# Patient Record
Sex: Female | Born: 1950 | Race: Black or African American | Hispanic: No | Smoking: Former smoker
Health system: Southern US, Community
[De-identification: ages and names within clinical notes are randomized; demographics above are authoritative.]

## PROBLEM LIST (undated history)

## (undated) DIAGNOSIS — M549 Dorsalgia, unspecified: Secondary | ICD-10-CM

## (undated) DIAGNOSIS — F32A Depression, unspecified: Secondary | ICD-10-CM

## (undated) DIAGNOSIS — D869 Sarcoidosis, unspecified: Secondary | ICD-10-CM

## (undated) DIAGNOSIS — E785 Hyperlipidemia, unspecified: Secondary | ICD-10-CM

## (undated) DIAGNOSIS — E559 Vitamin D deficiency, unspecified: Secondary | ICD-10-CM

## (undated) DIAGNOSIS — F329 Major depressive disorder, single episode, unspecified: Secondary | ICD-10-CM

## (undated) DIAGNOSIS — I10 Essential (primary) hypertension: Secondary | ICD-10-CM

## (undated) DIAGNOSIS — R42 Dizziness and giddiness: Secondary | ICD-10-CM

## (undated) DIAGNOSIS — K5190 Ulcerative colitis, unspecified, without complications: Secondary | ICD-10-CM

## (undated) DIAGNOSIS — R519 Headache, unspecified: Secondary | ICD-10-CM

## (undated) DIAGNOSIS — K219 Gastro-esophageal reflux disease without esophagitis: Secondary | ICD-10-CM

## (undated) DIAGNOSIS — R51 Headache: Secondary | ICD-10-CM

## (undated) DIAGNOSIS — G8929 Other chronic pain: Secondary | ICD-10-CM

## (undated) DIAGNOSIS — F419 Anxiety disorder, unspecified: Secondary | ICD-10-CM

## (undated) HISTORY — DX: Headache: R51

## (undated) HISTORY — DX: Dizziness and giddiness: R42

## (undated) HISTORY — PX: APPENDECTOMY: SHX54

## (undated) HISTORY — DX: Sarcoidosis, unspecified: D86.9

## (undated) HISTORY — DX: Vitamin D deficiency, unspecified: E55.9

## (undated) HISTORY — DX: Hyperlipidemia, unspecified: E78.5

## (undated) HISTORY — PX: KNEE SURGERY: SHX244

## (undated) HISTORY — DX: Headache, unspecified: R51.9

## (undated) HISTORY — DX: Anxiety disorder, unspecified: F41.9

## (undated) HISTORY — DX: Gastro-esophageal reflux disease without esophagitis: K21.9

## (undated) HISTORY — PX: TONSILLECTOMY: SUR1361

## (undated) HISTORY — PX: SHOULDER SURGERY: SHX246

## (undated) HISTORY — DX: Depression, unspecified: F32.A

## (undated) HISTORY — DX: Ulcerative colitis, unspecified, without complications: K51.90

## (undated) HISTORY — DX: Other chronic pain: G89.29

---
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
## 1999-02-02 ENCOUNTER — Ambulatory Visit (HOSPITAL_COMMUNITY): Admission: RE | Admit: 1999-02-02 | Discharge: 1999-02-02 | Payer: Self-pay | Admitting: Neurosurgery

## 1999-02-02 ENCOUNTER — Encounter: Payer: Self-pay | Admitting: Neurosurgery

## 2002-03-23 ENCOUNTER — Ambulatory Visit (HOSPITAL_COMMUNITY): Admission: RE | Admit: 2002-03-23 | Discharge: 2002-03-23 | Payer: Self-pay | Admitting: Orthopedic Surgery

## 2003-10-22 ENCOUNTER — Inpatient Hospital Stay (HOSPITAL_COMMUNITY): Admission: EM | Admit: 2003-10-22 | Discharge: 2003-10-24 | Payer: Self-pay | Admitting: Emergency Medicine

## 2005-05-22 ENCOUNTER — Encounter: Admission: RE | Admit: 2005-05-22 | Discharge: 2005-06-04 | Payer: Self-pay | Admitting: Pediatrics

## 2006-02-17 ENCOUNTER — Encounter: Admission: RE | Admit: 2006-02-17 | Discharge: 2006-04-10 | Payer: Self-pay | Admitting: Internal Medicine

## 2010-04-08 ENCOUNTER — Emergency Department (HOSPITAL_COMMUNITY): Admission: EM | Admit: 2010-04-08 | Discharge: 2010-04-08 | Payer: Self-pay | Admitting: Family Medicine

## 2010-05-03 ENCOUNTER — Encounter: Admission: RE | Admit: 2010-05-03 | Discharge: 2010-08-01 | Payer: Self-pay | Admitting: Orthopedic Surgery

## 2010-06-20 ENCOUNTER — Encounter: Admission: RE | Admit: 2010-06-20 | Discharge: 2010-06-20 | Payer: Self-pay | Admitting: Orthopedic Surgery

## 2011-05-03 NOTE — H&P (Signed)
NAME:  EMERSON, BARRETTO NO.:  [id number]   MEDICAL RECORD NO.:  [id number]                   PATIENT TYPE:  EMS   LOCATION:  ED                                   FACILITY:  [facility]   PHYSICIAN:  Sherin Quarry, MD                   DATE OF BIRTH:  [birth year]   DATE OF ADMISSION:  10/22/2003  DATE OF DISCHARGE:                                HISTORY & PHYSICAL   REASON FOR ADMISSION:  Caleb Popp is a pleasant lady who presents to  [facility] on the day of this dictation.  According to the  patient, two weeks ago she presented to her doctor with complaints of  abdominal pain and underwent a CT scan of the abdomen.  The CT scan of the  abdomen was reported to be normal but the lower portion of the lungs  revealed diffuse inflammatory changes.  Therefore, a CT scan of the chest  was scheduled and the patient was placed on Tequin on Tuesday of this week.  On Thursday after the CT scan was completed, the patient reports that she  experienced nausea and felt an unusual pounding feeling on both sides of her  chest.  On Friday, she was re-evaluated at her doctor's office and was  advised that her CT scan suggested that she had walking pneumonia.  She was  therefore placed on a Z-Pak as well as Tequin and was instructed to return  to the office in followup.  Today, she went to work at [employer] where she  works part-time.  While she was working there, she began to notice she was  having some difficulty breathing.  She felt cold, her chest felt tight, and  she says that she felt as though she could not catch her breath.  She  therefore presented to [facility] where a chest x-ray was obtained  which showed diffuse interstitial changes consistent with either atypical  pneumonia or an unusual presentation of congestive heart failure.  Of note  is that in 1993, the patient was evaluated by a pulmonologist in [facility]  because of an abnormal chest  x-ray.  This doctor suggested that she might  have sarcoidosis and did a bronchoscopy which apparently was nondiagnostic.  She thinks that she has had a chest x-ray since then that was normal but she  says it must have been several years ago.  She does not smoke.  She has not  been having any fever.  There has been no productive cough.  There has been  no chest discomfort with the exception of the unusual pounding feeling on  Thursday.  There has been no nausea or vomiting, no melena or hematochezia,  no unusual rash, and no unusual joint pain.  In light of her findings on  examination in the emergency room, it was felt prudent to admit her to  the  hospital at this time.   PAST MEDICAL HISTORY:   CURRENT MEDICATIONS:  1. Benicar HCT one tablet daily.  2. Prilosec 20 mg daily.  3. Tequin and Z-Pak which she is taking as noted above.   ALLERGIES:  She is ALLERGIC to SULFA DRUGS, PERCOCET, NEXIUM, CEFZIL, and  BETADINE.   ILLNESSES:  In addition to the above, the patient has a history of  hypertension which is currently well regulated.  She also has a history of  irritable bowel and has a colonoscopy every three years because of findings  of polyps.   OPERATIONS:  She has had a previous shoulder operation, appendectomy, and  tonsillectomy.   FAMILY HISTORY:  She states that she has a sibling who has hypertension and  two siblings who have atrial fibrillation.  Her father died of an MI in  53.  Her mother has dementia.   SOCIAL HISTORY:  She currently lives with her mother as well as with her  daughter.  She works part-time at [employer].  She does not smoke or abuse  alcohol or drugs according to the patient.   REVIEW OF SYSTEMS:  HEAD:  She denies headache or dizziness.  EYES:  She  denies visual blurring or diplopia. EAR, NOSE, AND THROAT:  Denies earache,  sinus pain, or sore throat.  CHEST:  She has not been coughing.  She denies  productive cough.  She denies respiratory  difficulty except for today.  CARDIOVASCULAR:  She denies orthopnea, PND, ankle edema.  She has had mild  exertional dyspnea.  ABDOMEN:  She has been having chronic complaints of a  burning feeling in the mid epigastric area.  She denies hematemesis or  melena.  There has been no hematochezia.  GU:  She denies dysuria or urinary  frequency, hesitancy, or nocturia.  RHEUMATOLOGIC:  She denies back pain or  joint pain.  HEMATOLOGIC:  She denies easy bleeding or bruising.  NEUROLOGICAL:  She denies history of seizure or stroke.   PHYSICAL EXAMINATION:  GENERAL:  She is a comfortable-appearing lady who  does not appear to be in distress.  HEENT:  Within normal limits.  CHEST:  A few fine rales at the right base.  There are no audible wheezes.  No rhonchi.  CARDIOVASCULAR:  Normal S1 & S2.  There are no rubs, murmurs, or gallops.  ABDOMEN:  Benign.  Normal bowel sounds without masses, tenderness, or  organomegaly.  NEUROLOGICAL:  Neurological testing is within normal limits.  EXTREMITIES:  No evaluation of cyanosis or edema.  DP and PT pulses are  intact.   As mentioned, chest x-ray shows diffuse patchy infiltrates of uncertain  etiology.  The white count is 7300.  The CMET is normal.   IMPRESSION:  1. Diffuse interstitial changes on chest x-ray with intermittent shortness     of breath.     a. Consider atypical pneumonia possibly of viral or bacterial origin.     b. Unusual presentation of congestive heart failure.     c. Possibly sarcoidosis or collagen vascular disease.  2. Hypertension.  3. History of chronic gastroesophageal reflux disease.  4. Chronic irritable bowel.  5. Multiple drug allergies.   PLAN:  To admit the patient to the hospital.  I am going to continue her on  Zithromax and Tequin as I think this is a good choice for her treatment if  this does turn out to be a pneumonia.  We will obtain a sed rate, a  BNP, ANA, and also a 2-D echocardiogram to evaluate cardiac  status.  I think this  patient would benefit from a pulmonary consult.  Will follow her oxygenation  closely.                                               Sherin Quarry, MD    SY/MEDQ  D:  10/22/2003  T:  10/22/2003  Job:  [id number]   cc:   Dr. Yves Dill  [place], [place]

## 2011-06-02 DIAGNOSIS — R079 Chest pain, unspecified: Secondary | ICD-10-CM

## 2011-06-02 DIAGNOSIS — I471 Supraventricular tachycardia: Secondary | ICD-10-CM

## 2011-06-04 DIAGNOSIS — R072 Precordial pain: Secondary | ICD-10-CM

## 2012-02-09 ENCOUNTER — Encounter (HOSPITAL_COMMUNITY): Payer: Self-pay | Admitting: Emergency Medicine

## 2012-02-09 ENCOUNTER — Emergency Department (HOSPITAL_COMMUNITY)
Admission: EM | Admit: 2012-02-09 | Discharge: 2012-02-09 | Disposition: A | Discharge status: Home / Self Care | Payer: Medicare Other | Source: Home / Self Care | Attending: Emergency Medicine | Admitting: Emergency Medicine

## 2012-02-09 DIAGNOSIS — M5430 Sciatica, unspecified side: Secondary | ICD-10-CM

## 2012-02-09 HISTORY — DX: Essential (primary) hypertension: I10

## 2012-02-09 MED ORDER — KETOROLAC TROMETHAMINE 60 MG/2ML IM SOLN: 60.0000 mg | Freq: Once | INTRAMUSCULAR | Status: DC

## 2012-02-09 MED ORDER — PREDNISONE 10 MG PO TABS: ORAL_TABLET | ORAL | Status: DC

## 2012-02-09 MED ORDER — TRAMADOL HCL 50 MG PO TABS: 100.0000 mg | ORAL_TABLET | Freq: Three times a day (TID) | ORAL | Status: AC | PRN

## 2012-02-09 MED ORDER — KETOROLAC TROMETHAMINE 30 MG/ML IJ SOLN
INTRAMUSCULAR | Status: AC
  Filled 2012-02-09: qty 1

## 2012-02-09 NOTE — ED Notes (Signed)
Pt having back pain since yesterday. Started in her left quad and moved to lower back and left glutei. The pain is not traveling all the way down to her left toes. She says she did not injure it or twist/pull. She has had problems with her back in the past and it has been good for months, but she has flare-ups on occasion.

## 2012-02-09 NOTE — ED Provider Notes (Signed)
Chief Complaint  Patient presents with  . Back Pain    History of Present Illness:   Erika Cherry is a 61 year old female who has a history of recurring back pain for the past 2 or 3 years. The pain got worse yesterday. She didn't do anything to bring this on such as excessive lifting bending. It just seemed to start on its own. The discomfort began in the left thigh and radiated into the buttock, lower back and subsequently down the leg and into the foot. She feels numbness and tingling but no weakness. She hasn't had any bladder or bowel complaints. The pain is worse with any type of movement except it's better if she does walking or exercises. She is seeing a neurosurgeon and [place] for this and has gotten epidural steroids for in the past.  Review of Systems:  Other than noted above, the patient denies any of the following symptoms: Systemic:  No fever, chills, fatigue, or weight loss. GI:  No abdominal pain, nausea, vomiting, diarrhea, constipation or blood in stool. GU:  No dysuria, frequency, urgency, or hematuria. No incontinence or difficulty urinating.  M-S:  No neck pain, joint pain, arthritis, or myalgias. Neuro:  No parethesias or muscular weakness. Skin:  No rash or itching.   PMFSH:  Past medical history, family history, social history, meds, and allergies were reviewed.  Physical Exam:   Vital signs:  BP 139/86  Pulse 73  Temp(Src) 97 F (36.1 C) (Oral)  Resp 23  SpO2 94% General:  Alert, oriented, in no distress. Abdomen:  Soft, non-tender.  No organomegaly or mass.  No pulsatile midline abdominal mass or bruit. Back:  There is tenderness to palpation in the lower to mid lumbar spine at the midline and also in the paravertebral muscles bilaterally with spasm of the paravertebral muscles. The back has almost 0 range of motion with pain and spasm with movement in all directions. Straight leg raising is positive on the left and negative on the right. Neuro:  Normal  muscle strength, sensations and DTRs. Skin:  Clear, warm and dry.  No rash.  Course in Urgent Care Center:   She was given Toradol 60 mg IM and tolerated this well without any immediate side effects or complications.   Assessment:   Diagnoses that have been ruled out:  None  Diagnoses that are still under consideration:  None  Final diagnoses:  Sciatica    Plan:   1.  The following meds were prescribed:   New Prescriptions   PREDNISONE (DELTASONE) 10 MG TABLET    Take 4 tabs daily for 4 days, 3 tabs daily for 4 days, 2 tabs daily for 4 days, then 1 tab daily for 4 days.   TRAMADOL (ULTRAM) 50 MG TABLET    Take 2 tablets (100 mg total) by mouth every 8 (eight) hours as needed for pain.   2.  The patient was instructed in symptomatic care and handouts were given. 3.  The patient was told to return if becoming worse in any way, if no better in 3 or 4 days, and given some red flag symptoms that would indicate earlier return.  Follow up:  The patient was told to follow up with her neurosurgeon if no better within a week.    Roque Lias, MD 02/09/12 [phone]

## 2012-02-09 NOTE — Discharge Instructions (Signed)
Back Exercises  Back exercises help treat and prevent back injuries. The goal is to increase your strength in your belly (abdominal) and back muscles. These exercises can also help with flexibility. Start these exercises when told by your doctor.  HOME CARE  Back exercises include:     Pelvic Tilt.  · Lie on your back with your knees bent. Tilt your pelvis until the lower part of your back is against the floor. Hold this position 5 to 10 sec. Repeat this exercise 5 to 10 times.      Knee to Chest.  · Pull 1 knee up against your chest and hold for 20 to 30 seconds. Repeat this with the other knee. This may be done with the other leg straight or bent, whichever feels better. Then, pull both knees up against your chest.      Sit-Ups or Curl-Ups.  · Bend your knees 90 degrees. Start with tilting your pelvis, and do a partial, slow sit-up. Only lift your upper half 30 to 45 degrees off the floor. Take at least 2 to 3 seonds for each sit-up. Do not do sit-ups with your knees out straight. If partial sit-ups are difficult, simply do the above but with only tightening your belly (abdominal) muscles and holding it as told.      Hip-Lift.  · Lie on your back with your knees flexed 90 degrees. Push down with your feet and shoulders as you raise your hips 2 inches off the floor. Hold for 10 seconds, repeat 5 to 10 times.      Back Arches.  · Lie on your stomach. Prop yourself up on bent elbows. Slowly press on your hands, causing an arch in your low back. Repeat 3 to 5 times.      Shoulder-Lifts.  · Lie face down with arms beside your body. Keep hips and belly pressed to floor as you slowly lift your head and shoulders off the floor.   Do not overdo your exercises. Be careful in the beginning. Exercises may cause you some mild back discomfort. If the pain lasts for more than 15 minutes, stop the exercises until you see your doctor. Improvement with exercise for back problems is slow.   Document Released: 01/04/2011 Document  Revised: 08/14/2011 Document Reviewed: 01/04/2011  [employer]® Patient Information ©2012 [employer].

## 2012-03-14 ENCOUNTER — Emergency Department (INDEPENDENT_AMBULATORY_CARE_PROVIDER_SITE_OTHER)
Admission: EM | Admit: 2012-03-14 | Discharge: 2012-03-14 | Disposition: A | Discharge status: Home / Self Care | Payer: Medicare Other | Source: Home / Self Care | Attending: Family Medicine | Admitting: Family Medicine

## 2012-03-14 ENCOUNTER — Encounter (HOSPITAL_COMMUNITY): Payer: Self-pay | Admitting: Emergency Medicine

## 2012-03-14 DIAGNOSIS — M549 Dorsalgia, unspecified: Secondary | ICD-10-CM

## 2012-03-14 HISTORY — DX: Dorsalgia, unspecified: M54.9

## 2012-03-14 MED ORDER — TRAMADOL HCL 50 MG PO TABS: 50.0000 mg | ORAL_TABLET | Freq: Four times a day (QID) | ORAL | Status: AC | PRN

## 2012-03-14 MED ORDER — KETOROLAC TROMETHAMINE 60 MG/2ML IM SOLN
INTRAMUSCULAR | Status: AC
  Filled 2012-03-14: qty 2

## 2012-03-14 MED ORDER — PREDNISONE (PAK) 10 MG PO TABS: ORAL_TABLET | ORAL | Status: AC

## 2012-03-14 MED ORDER — KETOROLAC TROMETHAMINE 60 MG/2ML IM SOLN
60.0000 mg | Freq: Once | INTRAMUSCULAR | Status: AC
  Administered 2012-03-14: 60 mg via INTRAMUSCULAR

## 2012-03-14 NOTE — ED Notes (Signed)
C/o low back pain and into left leg.  This episode of increased pain, unusually painful started yesterday. Back/leg pain is chronic, but this is unusually bad.  Reports left lower leg feels cold to touch.  To this nurse, no difference in temperature comparing both lower legs.

## 2012-03-14 NOTE — Discharge Instructions (Signed)
Take medications as directed. Use mild heat (heating pad, warm baths, etc) for 10 to 15 minutes, two to three times daily, as needed and as tolerated, taking care to not burn the skin. Follow up with the Orthopaedic provider as scheduled. Return to care sooner should your symptoms not improve, or worsen in any way, such as numbness, weakness, or tingling, or inability to control urine or bowel movements.

## 2012-03-14 NOTE — ED Provider Notes (Signed)
History     CSN: [id number]  Arrival date & time 03/14/12  1113   First MD Initiated Contact with Patient 03/14/12 1121      Chief Complaint  Patient presents with  . Back Pain    (Consider location/radiation/quality/duration/timing/severity/associated sxs/prior treatment) HPI Comments: Erika Cherry presents for evaluation of chronic low back pain with radiation to her left leg. She reports a long history of this and has been evaluated by orthopedics in the past. They wanted to do surgery at that time, but she declined. She reports, that she will go many months without symptoms. She reports occasional flares. She was seen for this one month ago here. She was given medications that did help her pain. She reports, that she has an appointment with orthopedics on April 17. She denies any weakness in the leg and has not fallen. She does report that her leg. Will feel "cold" at times.  Patient is a 61 y.o. female presenting with back pain. The history is provided by the patient.  Back Pain  This is a chronic problem. The current episode started more than 1 week ago. The problem occurs constantly. The problem has not changed since onset.The pain is associated with no known injury. The pain is present in the lumbar spine and sacro-iliac joint. The quality of the pain is described as shooting. The pain radiates to the left thigh. The pain is moderate. The symptoms are aggravated by bending. Pertinent negatives include no numbness, no bowel incontinence, no bladder incontinence, no dysuria, no paresthesias, no paresis, no tingling and no weakness. She has tried NSAIDs for the symptoms.    Past Medical History  Diagnosis Date  . Hypertension   . Back pain     Past Surgical History  Procedure Date  . Appendectomy   . Tonsillectomy   . Knee surgery   . Shoulder surgery     History reviewed. No pertinent family history.  History  Substance Use Topics  . Smoking status: Never Smoker   . Smokeless  tobacco: Not on file  . Alcohol Use: No    OB History    Grav Para Term Preterm Abortions TAB SAB Ect Mult Living                  Review of Systems  Constitutional: Negative.   HENT: Negative.   Eyes: Negative.   Respiratory: Negative.   Cardiovascular: Negative.   Gastrointestinal: Negative.  Negative for bowel incontinence.  Genitourinary: Negative.  Negative for bladder incontinence and dysuria.  Musculoskeletal: Positive for back pain.  Skin: Negative.   Neurological: Negative.  Negative for dizziness, tingling, weakness, numbness and paresthesias.    Allergies  Sulfur  Home Medications   Current Outpatient Rx  Name Route Sig Dispense Refill  . CLONIDINE HCL 0.1 MG PO TABS Oral Take 0.1 mg by mouth 2 (two) times daily.    . NEBIVOLOL HCL 10 MG PO TABS Oral Take 10 mg by mouth daily.    Marland Kitchen OMEPRAZOLE 20 MG PO CPDR Oral Take 20 mg by mouth daily.    Marland Kitchen POTASSIUM CHLORIDE ER 10 MEQ PO TBCR Oral Take 10 mEq by mouth 2 (two) times daily.    Marland Kitchen PREDNISONE 10 MG PO TABS  Take 4 tabs daily for 4 days, 3 tabs daily for 4 days, 2 tabs daily for 4 days, then 1 tab daily for 4 days. 40 tablet 0  . PREDNISONE (PAK) 10 MG PO TABS  Take 6 tablets on day  1, 5 tablets on day 2, 4 tablets on day 3, 3 tablets on day 4, 2 tablets on day 5, 1 tablet on day 6 21 tablet 0  . TRAMADOL HCL 50 MG PO TABS Oral Take 1 tablet (50 mg total) by mouth every 6 (six) hours as needed for pain. Maximum dose= 8 tablets per day 15 tablet 0    BP 191/98  Pulse 66  Temp(Src) 97 F (36.1 C) (Oral)  Resp 18  SpO2 98%  Physical Exam  Nursing note and vitals reviewed. Constitutional: She is oriented to person, place, and time. She appears well-developed and well-nourished.  HENT:  Head: Normocephalic and atraumatic.  Eyes: EOM are normal.  Neck: Normal range of motion.  Pulmonary/Chest: Effort normal.  Musculoskeletal: Normal range of motion.       Lumbar back: She exhibits tenderness, bony tenderness  and pain.       Back: positive straight leg raise on LEFT, negative straight leg raise on RIGHT, negative Fabers bilaterally, no pain with internal or external rotation bilaterally, full flexion, extension, abduction, and adduction; 5/5 strength   Neurological: She is alert and oriented to person, place, and time.  Skin: Skin is warm and dry.  Psychiatric: Her behavior is normal.    ED Course  Procedures (including critical care time)  Labs Reviewed - No data to display No results found.   1. Back pain       MDM  Given 60 mg Toradol IM x 1 in clinic; given rx for prednisone and tramadol which helped relieve her pain last time; she has an appointment with Orthopaedics on 04/01/2012        Renaee Munda, MD 03/14/12 1345

## 2012-10-25 ENCOUNTER — Emergency Department (HOSPITAL_COMMUNITY)
Admission: EM | Admit: 2012-10-25 | Discharge: 2012-10-25 | Disposition: A | Discharge status: Home / Self Care | Payer: Medicare Other | Source: Home / Self Care

## 2012-10-25 ENCOUNTER — Encounter (HOSPITAL_COMMUNITY): Payer: Self-pay | Admitting: *Deleted

## 2012-10-25 DIAGNOSIS — N760 Acute vaginitis: Secondary | ICD-10-CM

## 2012-10-25 LAB — WET PREP, GENITAL: Clue Cells Wet Prep HPF POC: NONE SEEN

## 2012-10-25 MED ORDER — FLUCONAZOLE 100 MG PO TABS: 100.0000 mg | ORAL_TABLET | Freq: Every day | ORAL | Status: DC

## 2012-10-25 MED ORDER — ESTROGENS, CONJUGATED 0.625 MG/GM VA CREA: TOPICAL_CREAM | Freq: Every day | VAGINAL | Status: DC

## 2012-10-25 NOTE — ED Provider Notes (Signed)
History     CSN: [id number]  Arrival date & time 10/25/12  1611   None     Chief Complaint  Patient presents with  . Vaginal Itching  . Dysuria    (Consider location/radiation/quality/duration/timing/severity/associated sxs/prior treatment) HPI Comments: 61 year old female who has had severe itching and burning in the whole of vaginal area for 2 months. She went to her PCP who treated her with Diflucan 150 mg once a week x4 weeks. But she did not improve at all. When she went back to the doctor he wrote prescription for Flagyl and she has finished that course and again no improvement in her symptoms. She presents with complaint of intense burning and itching of the external vulvovaginal area. It also burns upon urination but she denies other urinary symptoms. She denies vaginal discharge, fever or abdominopelvic pain. She states that her PCP did not examine her or take any type of swabs or samples.   Past Medical History  Diagnosis Date  . Hypertension   . Back pain     Past Surgical History  Procedure Date  . Appendectomy   . Tonsillectomy   . Knee surgery   . Shoulder surgery     Family History  Problem Relation Age of Onset  . Family history unknown: Yes    History  Substance Use Topics  . Smoking status: Never Smoker   . Smokeless tobacco: Not on file  . Alcohol Use: No    OB History    Grav Para Term Preterm Abortions TAB SAB Ect Mult Living                  Review of Systems  All other systems reviewed and are negative.    Allergies  Sulfur  Home Medications   Current Outpatient Rx  Name  Route  Sig  Dispense  Refill  . CLONIDINE HCL 0.1 MG PO TABS   Oral   Take 0.1 mg by mouth 2 (two) times daily.         . NEBIVOLOL HCL 10 MG PO TABS   Oral   Take 10 mg by mouth daily.         Marland Kitchen OMEPRAZOLE 20 MG PO CPDR   Oral   Take 20 mg by mouth daily.         Marland Kitchen POTASSIUM CHLORIDE ER 10 MEQ PO TBCR   Oral   Take 10 mEq by mouth 2 (two)  times daily.         Marland Kitchen PREDNISONE 10 MG PO TABS      Take 4 tabs daily for 4 days, 3 tabs daily for 4 days, 2 tabs daily for 4 days, then 1 tab daily for 4 days.   40 tablet   0   . ESTROGENS, CONJUGATED 0.625 MG/GM VA CREA   Vaginal   Place vaginally daily. Apply 1/2 applicator full in vagina  nightly   42.5 g   1   . FLUCONAZOLE 100 MG PO TABS   Oral   Take 1 tablet (100 mg total) by mouth daily.   14 tablet   0     BP 157/84  Pulse 76  Temp 98.3 F (36.8 C) (Oral)  Resp 17  SpO2 100%  Physical Exam  Constitutional: She is oriented to person, place, and time. She appears well-developed and well-nourished. No distress.  Neck: Normal range of motion. Neck supple.  Pulmonary/Chest: Effort normal.  Genitourinary:       External inspection  reveals minor erythema of the vulva. There are also areas of streaky red lesions possibly due to the infection versus scratching. Introitus is tender. There is no vaginal tenderness. No evidence of discharge the external vulvovaginal area was swabbed for a wet prep.  Neurological: She is alert and oriented to person, place, and time.  Skin: Skin is warm and dry.  Psychiatric: She has a normal mood and affect.    ED Course  Procedures (including critical care time)   Labs Reviewed  WET PREP, GENITAL   No results found.   1. Vulvovaginitis       MDM  Obtained a swab for wet prep studies. Diflucan 100 mg daily for 14 days Apply Monistat cream externally daily. Premarin vaginal cream apply one half applicator each bedtime.        Hayden Rasmussen, NP 10/25/12 1742

## 2012-10-25 NOTE — ED Notes (Signed)
Pt reports urinary burning and discomfort for the past 2 months - severe itching as well - has seen personal MD given diflucan with no relief, returned and given flagyl with no relief.

## 2012-10-26 NOTE — ED Notes (Addendum)
Final report of wet prep: negative for trichomonas,  Negative for yeast, negative for yeast, positive for clue cells. Adequate treatment w  fluconazole and premarin cream

## 2012-10-26 NOTE — ED Provider Notes (Signed)
Medical screening examination/treatment/procedure(s) were performed by resident physician or non-physician practitioner and as supervising physician I was immediately available for consultation/collaboration.   KINDL,JAMES DOUGLAS MD.    James D Kindl, MD 10/26/12 1438 

## 2012-12-20 ENCOUNTER — Emergency Department (INDEPENDENT_AMBULATORY_CARE_PROVIDER_SITE_OTHER): Payer: Medicare Other

## 2012-12-20 ENCOUNTER — Encounter (HOSPITAL_COMMUNITY): Payer: Self-pay | Admitting: Emergency Medicine

## 2012-12-20 ENCOUNTER — Emergency Department (HOSPITAL_COMMUNITY)
Admission: EM | Admit: 2012-12-20 | Discharge: 2012-12-20 | Disposition: A | Discharge status: Home / Self Care | Payer: Medicare Other | Source: Home / Self Care

## 2012-12-20 DIAGNOSIS — M722 Plantar fascial fibromatosis: Secondary | ICD-10-CM

## 2012-12-20 MED ORDER — TRAMADOL HCL 50 MG PO TABS: 50.0000 mg | ORAL_TABLET | Freq: Four times a day (QID) | ORAL | Status: DC | PRN

## 2012-12-20 NOTE — ED Provider Notes (Signed)
Erika Cherry is a 62 y.o. female who presents to Urgent Care today for left foot pain. Patient notes pain in her left foot starting Friday worsening until today.  She notes the pain is on the dorsum and plantar aspect of her foot. She notes the pain is worse with standing and activity. She denies any radiating pain weakness or numbness. Additionally she denies any injury. Additionally she denies any increase in her activity level. She's tried some over-the-counter pain medications which have only been a little helpful.  She denies any change in footwear.   She's a past history of tarsal tunnel release on the left side.    PMH reviewed. Hypertension History  Substance Use Topics  . Smoking status: Never Smoker   . Smokeless tobacco: Not on file  . Alcohol Use: No   ROS as above Medications reviewed. No current facility-administered medications for this encounter.   Current Outpatient Prescriptions  Medication Sig Dispense Refill  . cloNIDine (CATAPRES) 0.1 MG tablet Take 0.1 mg by mouth 2 (two) times daily.      . nebivolol (BYSTOLIC) 10 MG tablet Take 10 mg by mouth daily.      Marland Kitchen omeprazole (PRILOSEC) 20 MG capsule Take 20 mg by mouth daily.      . potassium chloride (KLOR-CON 10) 10 MEQ tablet Take 10 mEq by mouth 2 (two) times daily.      Marland Kitchen conjugated estrogens (PREMARIN) vaginal cream Place vaginally daily. Apply 1/2 applicator full in vagina  nightly  42.5 g  1  . fluconazole (DIFLUCAN) 100 MG tablet Take 1 tablet (100 mg total) by mouth daily.  14 tablet  0  . predniSONE (DELTASONE) 10 MG tablet Take 4 tabs daily for 4 days, 3 tabs daily for 4 days, 2 tabs daily for 4 days, then 1 tab daily for 4 days.  40 tablet  0  . traMADol (ULTRAM) 50 MG tablet Take 1 tablet (50 mg total) by mouth every 6 (six) hours as needed for pain.  30 tablet  0    Exam:  BP 165/82  Pulse 75  Temp 98 F (36.7 C) (Oral)  Resp 19  SpO2 96% Gen: Well NAD LEFT FOOT: Normal-appearing no significant  swelling or erythema.  Sensation is intact throughout as are pulses and capillary refill.  Foot motion she has pain with pronation, however strength is intact to pronation dorsal plantar flexion and supination.  Negative Tinel's over tarsal tunnel.  Tender palpation over the dorsal midfoot and medial plantar calcaneus.    No results found for this or any previous visit (from the past 24 hour(s)). Dg Foot Complete Left  12/20/2012  *RADIOLOGY REPORT*  Clinical Data: 2-day history of left mid foot pain.  No known injuries.  LEFT FOOT - COMPLETE 3+ VIEW  Comparison: None.  Findings: No evidence of acute or subacute fracture or dislocation. Mild generalized osseous demineralization.  Mild joint space narrowing involving the IP joints of multiple toes.  Mild joint space narrowing involving multiple midfoot joints.  Small plantar calcaneal spur.  Small enthesopathic spur at the insertion of the Achilles tendon on the posterior calcaneus.  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  Mild generalized osseous demineralization.   Original Report Authenticated By: Hulan Saas, M.D.     Assessment and Plan: 62 y.o. female with midfoot arthritis versus plantar fasciitis versus gout flare.  The most likely is arthritis flare.   Plan: Patient will use tramadol as  needed for pain. Additionally she has a walking boot at home from previous ankle surgery which she will use as needed for significant pain. If her pain persists beyond 2 weeks she will followup with orthopedic Dr. Patient expresses understanding and agreement.      Rodolph Bong, MD 12/20/12 [phone]

## 2012-12-20 NOTE — ED Notes (Signed)
Pt c/o left foot pain that started on Friday with top portion of the foot hurting and gradually moving to the bottom of the foot by the next day. Pt denies any injury. No hx of diabetes. Pt has tried warm soaks without relief of pain.

## 2012-12-20 NOTE — ED Notes (Signed)
Waiting discharge papers 

## 2012-12-20 NOTE — ED Provider Notes (Signed)
Medical screening examination/treatment/procedure(s) were performed by [facility] fellow and as supervising physician I was immediately available for consultation/collaboration.   Sharin Grave, MD   Sharin Grave, MD 12/20/12 [phone]

## 2013-06-06 ENCOUNTER — Encounter (HOSPITAL_COMMUNITY): Payer: Self-pay | Admitting: *Deleted

## 2013-06-06 ENCOUNTER — Emergency Department (INDEPENDENT_AMBULATORY_CARE_PROVIDER_SITE_OTHER): Payer: Medicare Other

## 2013-06-06 ENCOUNTER — Emergency Department (INDEPENDENT_AMBULATORY_CARE_PROVIDER_SITE_OTHER)
Admission: EM | Admit: 2013-06-06 | Discharge: 2013-06-06 | Disposition: A | Discharge status: Home / Self Care | Payer: Medicare Other | Source: Home / Self Care | Attending: Family Medicine | Admitting: Family Medicine

## 2013-06-06 DIAGNOSIS — M545 Low back pain: Secondary | ICD-10-CM

## 2013-06-06 MED ORDER — TRAMADOL HCL 50 MG PO TABS: 50.0000 mg | ORAL_TABLET | Freq: Four times a day (QID) | ORAL | Status: DC | PRN

## 2013-06-06 MED ORDER — KETOROLAC TROMETHAMINE 30 MG/ML IJ SOLN
INTRAMUSCULAR | Status: AC
  Filled 2013-06-06: qty 1

## 2013-06-06 MED ORDER — KETOROLAC TROMETHAMINE 30 MG/ML IJ SOLN
30.0000 mg | Freq: Once | INTRAMUSCULAR | Status: AC
Start: 2013-06-06 — End: 2013-06-06
  Administered 2013-06-06: 30 mg via INTRAMUSCULAR

## 2013-06-06 MED ORDER — CYCLOBENZAPRINE HCL 5 MG PO TABS: 5.0000 mg | ORAL_TABLET | Freq: Three times a day (TID) | ORAL | Status: DC | PRN

## 2013-06-06 NOTE — ED Notes (Signed)
Reports waking up yesterday with pain across low back; states pain much worse today with tingling down BLE.  Denies any injury.  Pt states pain much worse with any movement; has tried hot & cold compresses without relief.  Has not taken any meds for pain.

## 2013-06-06 NOTE — ED Provider Notes (Signed)
History     CSN: [id number]  Arrival date & time 06/06/13  1111   First MD Initiated Contact with Patient 06/06/13 1149      Chief Complaint  Patient presents with  . Back Pain    (Consider location/radiation/quality/duration/timing/severity/associated sxs/prior treatment) Patient is a 62 y.o. female presenting with back pain. The history is provided by the patient.  Back Pain Location:  Lumbar spine Quality:  Shooting Radiates to:  R posterior upper leg and L posterior upper leg Pain severity:  Moderate Onset quality:  Gradual Duration:  1 day Progression:  Waxing and waning Chronicity:  New Context comment:  Awoke yest with pain getting out of bed, no gi or gu sx, worse with movement   Past Medical History  Diagnosis Date  . Hypertension   . Back pain     Past Surgical History  Procedure Laterality Date  . Appendectomy    . Tonsillectomy    . Knee surgery    . Shoulder surgery      No family history on file.  History  Substance Use Topics  . Smoking status: Never Smoker   . Smokeless tobacco: Not on file  . Alcohol Use: No    OB History   Grav Para Term Preterm Abortions TAB SAB Ect Mult Living                  Review of Systems  Gastrointestinal: Negative.   Genitourinary: Negative.   Musculoskeletal: Positive for back pain.  Skin: Negative.     Allergies  Sulfur  Home Medications   Current Outpatient Rx  Name  Route  Sig  Dispense  Refill  . cloNIDine (CATAPRES) 0.1 MG tablet   Oral   Take 0.1 mg by mouth 2 (two) times daily.         . nebivolol (BYSTOLIC) 10 MG tablet   Oral   Take 10 mg by mouth daily.         Marland Kitchen omeprazole (PRILOSEC) 20 MG capsule   Oral   Take 20 mg by mouth daily.         . potassium chloride (KLOR-CON 10) 10 MEQ tablet   Oral   Take 10 mEq by mouth 2 (two) times daily.         Marland Kitchen conjugated estrogens (PREMARIN) vaginal cream   Vaginal   Place vaginally daily. Apply 1/2 applicator full in vagina   nightly   42.5 g   1   . cyclobenzaprine (FLEXERIL) 5 MG tablet   Oral   Take 1 tablet (5 mg total) by mouth 3 (three) times daily as needed for muscle spasms.   30 tablet   0   . fluconazole (DIFLUCAN) 100 MG tablet   Oral   Take 1 tablet (100 mg total) by mouth daily.   14 tablet   0   . predniSONE (DELTASONE) 10 MG tablet      Take 4 tabs daily for 4 days, 3 tabs daily for 4 days, 2 tabs daily for 4 days, then 1 tab daily for 4 days.   40 tablet   0   . traMADol (ULTRAM) 50 MG tablet   Oral   Take 1 tablet (50 mg total) by mouth every 6 (six) hours as needed for pain.   30 tablet   0   . traMADol (ULTRAM) 50 MG tablet   Oral   Take 1 tablet (50 mg total) by mouth every 6 (six) hours as needed  for pain.   30 tablet   0     BP 132/77  Pulse 83  Temp(Src) 98.4 F (36.9 C) (Oral)  Resp 22  SpO2 97%  Physical Exam  Nursing note and vitals reviewed. Constitutional: She is oriented to person, place, and time. She appears well-developed and well-nourished.  Musculoskeletal: She exhibits tenderness.       Lumbar back: She exhibits decreased range of motion, tenderness, bony tenderness and spasm. She exhibits normal pulse.       Back:  Neurological: She is alert and oriented to person, place, and time.  Skin: Skin is warm and dry.    ED Course  Procedures (including critical care time)  Labs Reviewed - No data to display Dg Lumbar Spine Complete  06/06/2013   *RADIOLOGY REPORT*  Clinical Data: Low back pain radiating into the lower extremities.  LUMBAR SPINE - COMPLETE 4+ VIEW  Comparison: MRI of the lumbar spine dated 06/20/2010.  Findings: There is mild osteophyte formation at the L4-5 level. Mild disc space narrowing is present at L5-S1.  Facet hypertrophy is present at multiple levels of the lumbar spine.  No focal bony lesions are identified.  There is no evidence of fracture or subluxation.  IMPRESSION: Lumbar spondylosis present, primarily at the L4-5 and  L5-S1 levels.   Original Report Authenticated By: Irish Lack, M.D.     1. Acute low back pain       MDM  X-rays reviewed and report per radiologist.           Linna Hoff, MD 06/06/13 1230

## 2013-06-06 NOTE — ED Notes (Signed)
Reports complete pain relief.

## 2014-02-16 ENCOUNTER — Ambulatory Visit (HOSPITAL_COMMUNITY): Payer: Self-pay | Admitting: Psychiatry

## 2014-03-08 ENCOUNTER — Ambulatory Visit (HOSPITAL_COMMUNITY): Payer: Self-pay | Admitting: Psychiatry

## 2014-03-30 ENCOUNTER — Ambulatory Visit (HOSPITAL_COMMUNITY): Payer: Self-pay | Admitting: Psychiatry

## 2014-06-08 ENCOUNTER — Ambulatory Visit (INDEPENDENT_AMBULATORY_CARE_PROVIDER_SITE_OTHER): Payer: Medicare HMO | Admitting: Psychiatry

## 2014-06-08 ENCOUNTER — Encounter (HOSPITAL_COMMUNITY): Payer: Self-pay | Admitting: Psychiatry

## 2014-06-08 DIAGNOSIS — F329 Major depressive disorder, single episode, unspecified: Secondary | ICD-10-CM

## 2014-06-08 DIAGNOSIS — F3289 Other specified depressive episodes: Secondary | ICD-10-CM

## 2014-06-08 NOTE — Patient Instructions (Signed)
Discussed orally 

## 2014-06-08 NOTE — Progress Notes (Signed)
Patient:   Erika Cherry   DOB:   [birth year]  MR Number:  [id number]  Location:  [address], [place], [place] [id number]  Date of Service:   Wednesday 06/08/2014  Start Time:   10:00 AM End Time:   10:55 AM  Provider/Observer:  Florencia ReasonsPeggy Bynum, MSW, LCSW  Billing Code/Service:  [phone]  Chief Complaint:     Chief Complaint  Patient presents with  . Anxiety  . Depression    Reason for Service:  Patient is referred for services by PCP Dr. Jacqlyn LarsenBulla due to patient experiencing symptoms of anxiety and depression. Per patient's report, symptoms initially began 8-9 years ago when her 64 year old mother died suddenly due to a stroke. Patient resided with her mother and says they were very close. Several of patient's friends have died in the past 3 years. She states having moments of depressed mood, not wanting to be around people,  being by herself most of the time but being lonely.  She has lost interest in most activities. Patient worries about her 64 year old daughter who also suffers from depression and anxiety. She resides with patient who reports financial stress trying to take care of herself as well as her daughter as patient is on a fixed income. Her daughter has applied for disability and status is pending.   Current Status:  Patient reports depressed mood, anxiety, excessive worry, isolative behaviors, irritability, loss of interest in activities, and memory difficulty.   Reliability of Information: Information gathered from patient.  Behavioral Observation: Erika Cherry  presents as a 63 y.o.-year-old Right-handed African American Female who appeared her stated age. Her dress was appropriate and her manners were appropriate to the situation.    She displayed an appropriate level of cooperation and motivation.    Interactions:    Active   Attention:   within normal limits  Memory:   Impaired immediate - recalled 1/3 words  Visuo-spatial:   not examined  Speech  (Volume):  normal  Speech:   normal pitch and normal volume  Thought Process:  Coherent and Relevant  Though Content:  WNL  Orientation:   person, place, situation, month of year and year  Judgment:   Good  Planning:   Fair  Affect:    Depressed  Mood:    Anxious and Depressed  Insight:   Fair  Intelligence:   normal  Marital Status/Living: [place] was born [place] Patient is third of seven siblings. Parents worked hard and  household was happy for the most part during patient's childhood.  Patient helped provide care for her younger siblings. Patient and husband were married for 18 months before divorcing due to husband's alcohol use. Patient has a 64 year-old daughter from that marriage. Patient and her daughter reside in [place]. Patient is spiritual and attends church most Sundays. Patient normally likes reading, visiting friends and relatives, talking on the phone, and watching  TV.  Current Employment: Patient became disabled 8-9 years ago due to vertigo. She reports symptoms ov vertigo have been resolved except for occasional episodes. However, she continues to have chronic back pain.  Past Employment:  Patient worked in Set designermanufacturing, Editor, commissioningprinting, and Clinical biochemistcustomer service.  Substance Use:  Patient reports past nicotine, marijuana,  and alcohol use - last used in the 1990's.  Education:   Patient completed high school  Medical History:   Past Medical History  Diagnosis Date  . Hypertension   . Back pain   Vertigo  Sexual History:   History  Sexual Activity  . Sexual Activity: Not on file    Abuse/Trauma History: Denies  Psychiatric History:  Patient reports no psychiatric hospitalizations and no previous outpatient therapy. She  began taking citalopram as prescribed by PCP in January 2015.  Family Med/Psych History: History reviewed. No pertinent family history.  Risk of Suicide/Violence: Patient denies past and current suicidal and  homicidal ideations. She reports no history of self-injurious behaviors, aggression, or violence.  Impression/DX:  The patient presents with symptoms of anxiety and depression which initially began when her mother died 8-9 years ago. She also reports grief and loss issues related to the deaths of several of her life long friends in the past 3 years. Symptoms have waxed and wane but have worsened in recent months. Patient reports stress related to financial issues as she is on a fixed income and concerns about her 64 year old daughter who resides with patient and suffers from depression and anxiety. Patient's current symptoms include  depressed mood, anxiety, excessive worry, isolative behaviors, irritability, loss of interest in activities, and memory difficulty. Diagnoses: Depressive disorder NOS, anxiety disorder NOS   Disposition/Plan:  Patient attends the assessment appointment today. Confidentiality and limits are discussed. The patient agrees to return for an appointment in 2 weeks for continuing assessment and treatment planning. The patient agrees to call this practice, call 911, or have someone take her to the emergency room should symptoms worsen.  Diagnosis:    Axis I:  Depressive disorder, not elsewhere classified      Axis II: Deferred       Axis III:  Hypertension, back pain, history of vertigo      Axis IV:  economic problems and problems with primary support group          Axis V:  51-60 moderate symptoms          BYNUM,PEGGY, LCSW

## 2014-06-22 ENCOUNTER — Ambulatory Visit (HOSPITAL_COMMUNITY): Payer: Self-pay | Admitting: Psychiatry

## 2014-07-05 ENCOUNTER — Ambulatory Visit (INDEPENDENT_AMBULATORY_CARE_PROVIDER_SITE_OTHER): Payer: Medicare HMO | Admitting: Psychiatry

## 2014-07-05 DIAGNOSIS — F3289 Other specified depressive episodes: Secondary | ICD-10-CM

## 2014-07-05 DIAGNOSIS — F329 Major depressive disorder, single episode, unspecified: Secondary | ICD-10-CM

## 2014-07-05 NOTE — Patient Instructions (Signed)
Discussed orally 

## 2014-07-05 NOTE — Progress Notes (Signed)
   THERAPIST PROGRESS NOTE  Session Time: Tuesday 07/05/2014 11:10 AM -11:55 AM  Participation Level: Active  Behavioral Response: CasualAlertAnxious  Type of Therapy: Individual Therapy  Treatment Goals addressed: Establish therapeutic alliance  Interventions: Supportive  Summary: Fontaine Noancy Touchton is a 63 y.o. female who is referred for services by PCP Dr. Jacqlyn LarsenBulla due to patient experiencing symptoms of anxiety and depression. Per patient's report, symptoms initially began 8-9 years ago when her 64 year old mother died suddenly due to a stroke. Patient resided with her mother and says they were very close. Several of patient's friends have died in the past 3 years. She states having moments of depressed mood, not wanting to be around people, being by herself most of the time but being lonely. She has lost interest in most activities. Patient worries about her 64 year old daughter who also suffers from depression and anxiety. She resides with patient who reports financial stress trying to take care of herself as well as her daughter as patient is on a fixed income. Her daughter has applied for disability and status is pending.   Patient states her mood has been pretty level and that she hasn't had that many down days since last session. She reports positive sleep pattern and regular physical activity but poor appetite stating she has never been a big eater. She continues to worry about daughter as she rarely goes out with her friends and seems to mainly be interested in reading. Patient compares daughter to self when she was daughter's age and states she was always out doing things. Upon further exploration, patient reports daughter seems to be content. Patient is lonely and reports missing doing things now that many of her friends and classmates are deceased. She continues to attend church regularly and goes out from time to time with her brothers.   Suicidal/Homicidal: No  Therapist Response:  Therapist works with patient to process feelings, identify realistic expectations of daughter, explore ways for patient to increase her social involvement  Plan: Return again in 2-3 weeks.  Diagnosis: Axis I: Depressive Disorder NOS    Axis II: Deferred    Diahn Waidelich, LCSW 07/05/2014

## 2014-08-29 ENCOUNTER — Ambulatory Visit (HOSPITAL_COMMUNITY): Payer: Self-pay | Admitting: Psychiatry

## 2014-09-09 ENCOUNTER — Ambulatory Visit (HOSPITAL_COMMUNITY): Payer: Self-pay | Admitting: Psychiatry

## 2014-09-14 ENCOUNTER — Ambulatory Visit (HOSPITAL_COMMUNITY): Payer: Self-pay | Admitting: Psychiatry

## 2014-10-07 ENCOUNTER — Ambulatory Visit (HOSPITAL_COMMUNITY): Payer: Self-pay | Admitting: Psychiatry

## 2014-11-07 ENCOUNTER — Ambulatory Visit (HOSPITAL_COMMUNITY): Payer: Self-pay | Admitting: Psychiatry

## 2015-05-23 ENCOUNTER — Ambulatory Visit (INDEPENDENT_AMBULATORY_CARE_PROVIDER_SITE_OTHER): Payer: Medicare HMO | Admitting: Emergency Medicine

## 2015-05-23 ENCOUNTER — Encounter: Payer: Self-pay | Admitting: *Deleted

## 2015-05-23 ENCOUNTER — Other Ambulatory Visit: Payer: Self-pay | Admitting: *Deleted

## 2015-05-23 ENCOUNTER — Ambulatory Visit (INDEPENDENT_AMBULATORY_CARE_PROVIDER_SITE_OTHER)
Admission: RE | Admit: 2015-05-23 | Discharge: 2015-05-23 | Disposition: A | Discharge status: Home / Self Care | Payer: Medicare HMO | Source: Ambulatory Visit | Attending: Emergency Medicine | Admitting: Emergency Medicine

## 2015-05-23 VITALS — BP 122/88 | HR 78 | Ht 69.5 in | Wt 205.0 lb

## 2015-05-23 DIAGNOSIS — R0609 Other forms of dyspnea: Secondary | ICD-10-CM | POA: Diagnosis not present

## 2015-05-23 DIAGNOSIS — D869 Sarcoidosis, unspecified: Secondary | ICD-10-CM | POA: Diagnosis not present

## 2015-05-23 NOTE — Assessment & Plan Note (Signed)
She was given this diagnosis by Dr. Orson AloeHenderson in [place]. Not clear to me yet whether she underwent biopsy or what kind of tissue was sampled. We will try to get the records from his office to clarify. In the meantime she will need pulmonary function testing to establish her current baseline and to evaluate for obstructive lung disease. She has a [facility] report [facility] in 2014 that was normal. I will repeat her chest x-ray today. She will likely need a CT chest at some point in the future but I would like to see the chest x-ray first. She denies any skin or neurological manifestations. She had a recent ophthalmology appointment which identified cataracts but did not see any evidence of sarcoid

## 2015-05-23 NOTE — Progress Notes (Signed)
Subjective:    Patient ID: Erika Cherry, female    DOB: Jul 17, 1951, 64 y.o.   MRN: [id number]  HPI 64 year old former smoker, history of obesity, hypertension, ulcerative colitis. She carries diagnosis of sarcoidosis that was made by Dr Cherie OuchWilliam Henderson, she believes by biopsy. She has been treated with pred in the past, not in the last few years. History is taken from the pt and her daughter. She reports that her breathing has been difficult with any exertion. Her daughter notes heavy breathing. She is able to shop through the grocery store without stopping. She denies any rash, had an optho appointment recently. She seldom coughs. She does have some wheezing. She has gained about 20-30 lbs since Nov '15.  I have reviewed her office note [facility] from 05/05/15, indicates that she had spirometry on 07/21/13 although I don't have this available at this time.   Review of Systems  Constitutional: Negative for fever and unexpected weight change.  HENT: Positive for dental problem. Negative for congestion, ear pain, nosebleeds, postnasal drip, rhinorrhea, sinus pressure, sneezing, sore throat and trouble swallowing.   Eyes: Negative for redness and itching.  Respiratory: Positive for shortness of breath. Negative for cough, chest tightness and wheezing.   Cardiovascular: Negative for palpitations and leg swelling.  Gastrointestinal: Negative for nausea and vomiting.  Genitourinary: Negative for dysuria.  Musculoskeletal: Negative for joint swelling.  Skin: Negative for rash.  Neurological: Negative for headaches.  Hematological: Does not bruise/bleed easily.  Psychiatric/Behavioral: Positive for dysphoric mood. The patient is nervous/anxious.     Past Medical History  Diagnosis Date  . Hypertension   . Back pain   . Chronic headaches   . Sarcoidosis   . Ulcerative colitis   . Vitamin D deficiency   . Hyperlipidemia      Family History  Problem Relation Age of Onset  .  Heart disease Brother   . Hyperlipidemia Mother   . Hypertension Mother      History   Social History  . Marital Status: Single    Spouse Name: N/A  . Number of Children: N/A  . Years of Education: N/A   Occupational History  . Not on file.   Social History Main Topics  . Smoking status: Former Smoker    Quit date: 06/08/1998  . Smokeless tobacco: Never Used  . Alcohol Use: No     Comment: past occasional marijuana use ( last used in the 1990's)  . Drug Use: No  . Sexual Activity: Not on file   Other Topics Concern  . 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 Sulfur      Outpatient Prescriptions Prior to Visit  Medication Sig Dispense Refill  . amLODipine (NORVASC) 5 MG tablet Take 5 mg by mouth daily.    . carvedilol (COREG) 6.25 MG tablet Take 6.25 mg by mouth 2 (two) times daily with a meal.    . citalopram (CELEXA) 20 MG tablet Take 30 mg by mouth daily.     . cloNIDine (CATAPRES) 0.1 MG tablet Take 0.1 mg by mouth 2 (two) times daily.    . fenofibrate 160 MG tablet Take 160 mg by mouth daily.    Marland Kitchen. HYDROcodone-acetaminophen (NORCO) 10-325 MG per tablet Take 1 tablet by mouth 3 (three) times daily as needed.     . potassium chloride (KLOR-CON 10) 10 MEQ tablet Take 10 mEq  by mouth 2 (two) times daily.    Marland Kitchen omeprazole (PRILOSEC) 20 MG capsule Take 20 mg by mouth daily.    Marland Kitchen conjugated estrogens (PREMARIN) vaginal cream Place vaginally daily. Apply 1/2 applicator full in vagina  nightly 42.5 g 1  . cyclobenzaprine (FLEXERIL) 5 MG tablet Take 1 tablet (5 mg total) by mouth 3 (three) times daily as needed for muscle spasms. 30 tablet 0  . fluconazole (DIFLUCAN) 100 MG tablet Take 1 tablet (100 mg total) by mouth daily. 14 tablet 0  . metoCLOPramide (REGLAN) 10 MG tablet Take 10 mg by mouth 4 (four) times daily.    . nebivolol (BYSTOLIC) 10 MG tablet Take 10 mg by mouth daily.    .  predniSONE (DELTASONE) 10 MG tablet Take 4 tabs daily for 4 days, 3 tabs daily for 4 days, 2 tabs daily for 4 days, then 1 tab daily for 4 days. 40 tablet 0  . traMADol (ULTRAM) 50 MG tablet Take 1 tablet (50 mg total) by mouth every 6 (six) hours as needed for pain. 30 tablet 0  . traMADol (ULTRAM) 50 MG tablet Take 1 tablet (50 mg total) by mouth every 6 (six) hours as needed for pain. 30 tablet 0   No facility-administered medications prior to visit.         Objective:   Physical Exam Filed Vitals:   05/23/15 1330 05/23/15 1331  BP:  122/88  Pulse:  78  Height: 5' 9.5" (1.765 m)   Weight: 205 lb (92.987 kg)   SpO2:  95%   Gen: Pleasant, obese, in no distress,  normal affect  ENT: No lesions,  mouth clear,  oropharynx clear, no postnasal drip  Neck: No JVD, no TMG, no carotid bruits  Lungs: No use of accessory muscles, clear without rales or rhonchi  Cardiovascular: RRR, heart sounds normal, no murmur or gallops, no peripheral edema  Musculoskeletal: No deformities, no cyanosis or clubbing  Neuro: alert, non focal  Skin: Warm, no lesions or rashes      Assessment & Plan:  Dyspnea on exertion I suspect that at least a component of her dyspnea is due to deconditioning and weight gain. Certainly she may have some component of obstructive lung disease given her reported diagnosis of sarcoidosis. We will perform full pulmonary function testing before deciding whether bronchodilators are indicated. Walking oximetry will be performed today   Sarcoidosis She was given this diagnosis by Dr. Orson Aloe in [place]. Not clear to me yet whether she underwent biopsy or what kind of tissue was sampled. We will try to get the records from his office to clarify. In the meantime she will need pulmonary function testing to establish her current baseline and to evaluate for obstructive lung disease. She has a chest x-ray report from [facility] in 2014 that was normal. I will repeat her chest  x-ray today. She will likely need a CT chest at some point in the future but I would like to see the chest x-ray first. She denies any skin or neurological manifestations. She had a recent ophthalmology appointment which identified cataracts but did not see any evidence of sarcoid

## 2015-05-23 NOTE — Patient Instructions (Signed)
We will perform walking oximetry today We will perform full pulmonary function testing at [facility] Chest x-ray today We will obtain your records from Dr. Donnamarie PoagHenderson's office Follow with Dr Delton CoombesByrum next available appointment following your breathing tests

## 2015-05-23 NOTE — Assessment & Plan Note (Signed)
I suspect that at least a component of her dyspnea is due to deconditioning and weight gain. Certainly she may have some component of obstructive lung disease given her reported diagnosis of sarcoidosis. We will perform full pulmonary function testing before deciding whether bronchodilators are indicated. Walking oximetry will be performed today

## 2015-05-24 ENCOUNTER — Ambulatory Visit (HOSPITAL_COMMUNITY)
Admission: RE | Admit: 2015-05-24 | Discharge: 2015-05-24 | Disposition: A | Discharge status: Home / Self Care | Payer: Medicare HMO | Source: Ambulatory Visit | Attending: Emergency Medicine | Admitting: Emergency Medicine

## 2015-05-24 DIAGNOSIS — D860 Sarcoidosis of lung: Secondary | ICD-10-CM | POA: Diagnosis not present

## 2015-05-24 DIAGNOSIS — D869 Sarcoidosis, unspecified: Secondary | ICD-10-CM

## 2015-05-24 DIAGNOSIS — R0600 Dyspnea, unspecified: Secondary | ICD-10-CM | POA: Insufficient documentation

## 2015-05-24 LAB — PULMONARY FUNCTION TEST
DL/VA % pred: 71 %
DL/VA: 3.85 ml/min/mmHg/L
DLCO UNC % PRED: 50 %
DLCO unc: 15.94 ml/min/mmHg
FEF 25-75 POST: 3.22 L/s
FEF 25-75 Pre: 2.95 L/sec
FEF2575-%Change-Post: 9 %
FEF2575-%PRED-PRE: 128 %
FEF2575-%Pred-Post: 140 %
FEV1-%CHANGE-POST: 2 %
FEV1-%PRED-POST: 102 %
FEV1-%PRED-PRE: 100 %
FEV1-POST: 2.53 L
FEV1-PRE: 2.48 L
FEV1FVC-%Change-Post: 1 %
FEV1FVC-%PRED-PRE: 108 %
FEV6-%Change-Post: 0 %
FEV6-%PRED-POST: 96 %
FEV6-%Pred-Pre: 95 %
FEV6-POST: 2.95 L
FEV6-Pre: 2.94 L
FEV6FVC-%PRED-PRE: 103 %
FEV6FVC-%Pred-Post: 103 %
FVC-%CHANGE-POST: 0 %
FVC-%PRED-POST: 93 %
FVC-%Pred-Pre: 92 %
FVC-POST: 2.95 L
FVC-PRE: 2.94 L
POST FEV6/FVC RATIO: 100 %
PRE FEV1/FVC RATIO: 84 %
Post FEV1/FVC ratio: 86 %
Pre FEV6/FVC Ratio: 100 %
RV % PRED: 88 %
RV: 2.08 L
TLC % PRED: 88 %
TLC: 5.23 L

## 2015-05-24 MED ORDER — ALBUTEROL SULFATE (2.5 MG/3ML) 0.083% IN NEBU
2.5000 mg | INHALATION_SOLUTION | Freq: Once | RESPIRATORY_TRACT | Status: AC
  Administered 2015-05-24: 2.5 mg via RESPIRATORY_TRACT

## 2015-07-10 ENCOUNTER — Ambulatory Visit (INDEPENDENT_AMBULATORY_CARE_PROVIDER_SITE_OTHER): Payer: Medicare HMO | Admitting: Emergency Medicine

## 2015-07-10 ENCOUNTER — Encounter: Payer: Self-pay | Admitting: Emergency Medicine

## 2015-07-10 DIAGNOSIS — D869 Sarcoidosis, unspecified: Secondary | ICD-10-CM | POA: Diagnosis not present

## 2015-07-10 NOTE — Patient Instructions (Signed)
We will perform a high-resolution CT scan of your chest We will perform an echocardiogram In the meantime please slowly work on trying to increase your exercise to help with your cardiopulmonary conditioning Follow with Dr Delton Coombes in 1 month or next available appointment to review your testing

## 2015-07-10 NOTE — Assessment & Plan Note (Signed)
CT scan the chest will be performed as above to assess for any evidence of active sarcoidosis. I believe she also needs an echocardiogram to assess for possible blurry hypertension. This will be ordered at [facility].

## 2015-07-10 NOTE — Progress Notes (Signed)
Subjective:    Patient ID: Fontaine No, female    DOB: 05/27/1951, 64 y.o.   MRN: [id number]  HPI 64 year old former smoker, history of obesity, hypertension, ulcerative colitis. She carries diagnosis of sarcoidosis that was made by Dr Cherie Ouch, she believes by biopsy. She has been treated with pred in the past, not in the last few years. History is taken from the pt and her daughter. She reports that her breathing has been difficult with any exertion. Her daughter notes heavy breathing. She is able to shop through the grocery store without stopping. She denies any rash, had an optho appointment recently. She seldom coughs. She does have some wheezing. She has gained about 20-30 lbs since Nov '15.  I have reviewed her office note from [facility] from 05/05/15, indicates that she had spirometry on 07/21/13 although I don't have this available at this time.   ROV 07/10/15 -- Follow-up visit for history of sarcoidosis, exertional dyspnea. We ordered a chest x-ray and pulmonary function testing which I personally reviewed today. She is here with her daughter and history is taken from them both. She continues to have some exertional shortness of breath when she pushes herself. She's not had any significant cough or wheezing since her last visit.  Walking oximetry last visit was good, no evidence of desaturation.  She has undergone TTE at [facility] remotely but not recently. Her most recent CT scan of the chest was in 2012.   Review of Systems  Constitutional: Negative for fever and unexpected weight change.  HENT: Positive for dental problem. Negative for congestion, ear pain, nosebleeds, postnasal drip, rhinorrhea, sinus pressure, sneezing, sore throat and trouble swallowing.   Eyes: Negative for redness and itching.  Respiratory: Positive for shortness of breath. Negative for cough, chest tightness and wheezing.   Cardiovascular: Negative for palpitations and leg swelling.    Gastrointestinal: Negative for nausea and vomiting.  Genitourinary: Negative for dysuria.  Musculoskeletal: Negative for joint swelling.  Skin: Negative for rash.  Neurological: Negative for headaches.  Hematological: Does not bruise/bleed easily.  Psychiatric/Behavioral: Positive for dysphoric mood. The patient is nervous/anxious.         Objective:   Physical Exam Filed Vitals:   07/10/15 1557  BP: 134/84  Pulse: 80  Height:  (1.753 m)  Weight: 204 lb (92.534 kg)  SpO2: 97%   Gen: Pleasant, obese, in no distress,  normal affect  ENT: No lesions,  mouth clear,  oropharynx clear, no postnasal drip  Neck: No JVD, no TMG, no carotid bruits  Lungs: No use of accessory muscles, clear without rales or rhonchi  Cardiovascular: RRR, heart sounds normal, no murmur or gallops, no peripheral edema  Musculoskeletal: No deformities, no cyanosis or clubbing  Neuro: alert, non focal  Skin: Warm, no lesions or rashes   05/23/15 --  COMPARISON: 06/28/2011 chest CT.  FINDINGS: Borderline heart size for projection. Low volume chest with perihilar interstitial opacities, likely related to the history of sarcoidosis. No gross mediastinal or hilar adenopathy identified radiographically. No pleural effusion. No airspace disease.  IMPRESSION: Low volume chest with perihilar opacity which may represent lung changes of sarcoidosis or perihilar atelectasis. No acute cardiopulmonary disease     Assessment & Plan:  Sarcoidosis I reviewed her chest x-ray from 05/2015. This shows some left mid lung interstitial changes. The right side is clear. I would like to perform a high-resolution CT scan of the chest to compare with 2012. This will be ordered to  be done at [facility]. I have reviewed her pulmonary function testing performed on 05/24/15 this showed normal airflows without any significant bronchodilator or spots of this. Her lung volumes were normal and her diffusion capacity was  decreased without full correction when adjusted for alveolar volume. She did not desaturate on a ambulatory oximetry in our office last visit. At this time I will defer bronchodilators.   Dyspnea on exertion CT scan the chest will be performed as above to assess for any evidence of active sarcoidosis. I believe she also needs an echocardiogram to assess for possible blurry hypertension. This will be ordered at [facility].

## 2015-07-10 NOTE — Assessment & Plan Note (Signed)
I reviewed her chest x-ray from 05/2015. This shows some left mid lung interstitial changes. The right side is clear. I would like to perform a high-resolution CT scan of the chest to compare with 2012. This will be ordered to be done at [facility]. I have reviewed her pulmonary function testing performed on 05/24/15 this showed normal airflows without any significant bronchodilator or spots of this. Her lung volumes were normal and her diffusion capacity was decreased without full correction when adjusted for alveolar volume. She did not desaturate on a ambulatory oximetry in our office last visit. At this time I will defer bronchodilators.

## 2015-07-14 ENCOUNTER — Ambulatory Visit (HOSPITAL_COMMUNITY): Payer: Medicare HMO

## 2015-07-19 ENCOUNTER — Ambulatory Visit (HOSPITAL_COMMUNITY): Payer: Medicare HMO

## 2015-07-19 ENCOUNTER — Other Ambulatory Visit (HOSPITAL_COMMUNITY): Payer: Self-pay

## 2015-07-21 ENCOUNTER — Ambulatory Visit (HOSPITAL_COMMUNITY): Payer: Medicare HMO

## 2015-07-21 ENCOUNTER — Ambulatory Visit (HOSPITAL_COMMUNITY): Payer: Medicare HMO | Attending: Emergency Medicine

## 2015-08-07 ENCOUNTER — Ambulatory Visit: Payer: Self-pay | Admitting: Emergency Medicine

## 2016-08-22 ENCOUNTER — Emergency Department (HOSPITAL_COMMUNITY): Payer: Medicare HMO

## 2016-08-22 ENCOUNTER — Encounter (HOSPITAL_COMMUNITY): Payer: Self-pay

## 2016-08-22 ENCOUNTER — Emergency Department (HOSPITAL_COMMUNITY)
Admission: EM | Admit: 2016-08-22 | Discharge: 2016-08-22 | Disposition: A | Discharge status: Home / Self Care | Payer: Medicare HMO | Attending: Emergency Medicine | Admitting: Emergency Medicine

## 2016-08-22 DIAGNOSIS — S0993XA Unspecified injury of face, initial encounter: Secondary | ICD-10-CM | POA: Diagnosis present

## 2016-08-22 DIAGNOSIS — Y999 Unspecified external cause status: Secondary | ICD-10-CM | POA: Insufficient documentation

## 2016-08-22 DIAGNOSIS — Y929 Unspecified place or not applicable: Secondary | ICD-10-CM | POA: Insufficient documentation

## 2016-08-22 DIAGNOSIS — R42 Dizziness and giddiness: Secondary | ICD-10-CM

## 2016-08-22 DIAGNOSIS — S00511A Abrasion of lip, initial encounter: Secondary | ICD-10-CM | POA: Diagnosis not present

## 2016-08-22 DIAGNOSIS — W19XXXA Unspecified fall, initial encounter: Secondary | ICD-10-CM

## 2016-08-22 DIAGNOSIS — K08119 Complete loss of teeth due to trauma, unspecified class: Secondary | ICD-10-CM | POA: Insufficient documentation

## 2016-08-22 DIAGNOSIS — Z23 Encounter for immunization: Secondary | ICD-10-CM | POA: Insufficient documentation

## 2016-08-22 DIAGNOSIS — Z87891 Personal history of nicotine dependence: Secondary | ICD-10-CM | POA: Insufficient documentation

## 2016-08-22 DIAGNOSIS — W109XXA Fall (on) (from) unspecified stairs and steps, initial encounter: Secondary | ICD-10-CM | POA: Diagnosis not present

## 2016-08-22 DIAGNOSIS — I10 Essential (primary) hypertension: Secondary | ICD-10-CM | POA: Insufficient documentation

## 2016-08-22 DIAGNOSIS — S80212A Abrasion, left knee, initial encounter: Secondary | ICD-10-CM | POA: Insufficient documentation

## 2016-08-22 DIAGNOSIS — Y9301 Activity, walking, marching and hiking: Secondary | ICD-10-CM | POA: Diagnosis not present

## 2016-08-22 LAB — BASIC METABOLIC PANEL
ANION GAP: 10 (ref 5–15)
BUN: 6 mg/dL (ref 6–20)
CALCIUM: 9.5 mg/dL (ref 8.9–10.3)
CHLORIDE: 108 mmol/L (ref 101–111)
CO2: 25 mmol/L (ref 22–32)
Creatinine, Ser: 0.75 mg/dL (ref 0.44–1.00)
GFR calc non Af Amer: >60 mL/min (ref >60)
Glucose, Bld: 96 mg/dL (ref 65–99)
Potassium: 3.5 mmol/L (ref 3.5–5.1)
SODIUM: 143 mmol/L (ref 135–145)

## 2016-08-22 LAB — CBC WITH DIFFERENTIAL/PLATELET
BASOS ABS: 0.1 10*3/uL (ref 0.0–0.1)
Basophils Relative: 1 %
Eosinophils Absolute: 1.2 10*3/uL — ABNORMAL HIGH (ref 0.0–0.7)
Eosinophils Relative: 11 %
HEMATOCRIT: 37.9 % (ref 36.0–46.0)
HEMOGLOBIN: 11.7 g/dL — AB (ref 12.0–15.0)
LYMPHS PCT: 18 %
Lymphs Abs: 2 10*3/uL (ref 0.7–4.0)
MCH: 22.5 pg — ABNORMAL LOW (ref 26.0–34.0)
MCHC: 30.9 g/dL (ref 30.0–36.0)
MCV: 72.7 fL — ABNORMAL LOW (ref 78.0–100.0)
MONOS PCT: 7 %
Monocytes Absolute: 0.8 10*3/uL (ref 0.1–1.0)
NEUTROS PCT: 63 %
Neutro Abs: 6.8 10*3/uL (ref 1.7–7.7)
Platelets: 319 10*3/uL (ref 150–400)
RBC: 5.21 MIL/uL — AB (ref 3.87–5.11)
RDW: 18.5 % — ABNORMAL HIGH (ref 11.5–15.5)
WBC: 10.9 10*3/uL — AB (ref 4.0–10.5)

## 2016-08-22 LAB — I-STAT TROPONIN, ED: TROPONIN I, POC: 0 ng/mL (ref 0.00–0.08)

## 2016-08-22 MED ORDER — HYDROCODONE-ACETAMINOPHEN 5-325 MG PO TABS
1.0000 | ORAL_TABLET | Freq: Once | ORAL | Status: AC
  Administered 2016-08-22: 1 via ORAL
  Filled 2016-08-22: qty 1

## 2016-08-22 MED ORDER — MECLIZINE HCL 25 MG PO TABS
25.0000 mg | ORAL_TABLET | Freq: Once | ORAL | Status: AC
  Administered 2016-08-22: 25 mg via ORAL
  Filled 2016-08-22: qty 1

## 2016-08-22 MED ORDER — CARVEDILOL 12.5 MG PO TABS: 6.2500 mg | ORAL_TABLET | Freq: Once | ORAL | Status: DC

## 2016-08-22 MED ORDER — AMLODIPINE BESYLATE 5 MG PO TABS
5.0000 mg | ORAL_TABLET | Freq: Once | ORAL | Status: AC
  Administered 2016-08-22: 5 mg via ORAL
  Filled 2016-08-22: qty 1

## 2016-08-22 MED ORDER — NAPROXEN 500 MG PO TABS: 500.0000 mg | ORAL_TABLET | Freq: Two times a day (BID) | ORAL | 0 refills | Status: AC

## 2016-08-22 MED ORDER — MECLIZINE HCL 25 MG PO TABS: 25.0000 mg | ORAL_TABLET | Freq: Three times a day (TID) | ORAL | 0 refills | Status: AC | PRN

## 2016-08-22 MED ORDER — TETANUS-DIPHTH-ACELL PERTUSSIS 5-2.5-18.5 LF-MCG/0.5 IM SUSP
0.5000 mL | Freq: Once | INTRAMUSCULAR | Status: AC
  Administered 2016-08-22: 0.5 mL via INTRAMUSCULAR
  Filled 2016-08-22: qty 0.5

## 2016-08-22 MED ORDER — HYDROCODONE-ACETAMINOPHEN 5-325 MG PO TABS: 1.0000 | ORAL_TABLET | ORAL | 0 refills | Status: AC | PRN

## 2016-08-22 NOTE — ED Notes (Signed)
Spoke with Dr. Estell HarpinZammit,, verbal orders given to order CT. Face, head and cervical spine.

## 2016-08-22 NOTE — ED Provider Notes (Signed)
[facility]-EMERGENCY DEPT Provider Note   CSN: [id number] Arrival date & time: 08/22/16  1037     History   Chief Complaint Chief Complaint  Patient presents with  . Fall    HPI Erika Cherry is a 65 y.o. female.  The history is provided by the patient and medical records.  Fall    65 year old female with history of chronic back pain, hyperlipidemia, hypertension, ulcerative colitis, sarcoidosis, chronic vertigo, presenting to the ED for a fall. Patient states she was walking down her front steps this morning out of her house when she lost her footing and fell. Patient was wearing slide on sandals and feels this contributed to her fall.  Denies any dizziness or syncope at the time of fall. She landed on her left side but ended up falling onto her face as well. She denies loss of consciousness. States she did knock a few of her front bottom teeth out. States she also has some abrasions to her lower lip and left knee. She has been able to ambulate since the fall without difficulty.  States she feels some mild soreness of her left knee, worse with weightbearing and ambulation. She denies any current headache but states she is beginning to feel mildly dizzy and "swimmy headed". States this was not present prior to the fall. Patient does have history of chronic vertigo, states this has been waxing and waning recently. She denies any visual disturbance, changes in speech, difficulty ambulating, focal numbness, or focal weakness. She is not currently on any type of anticoagulation. Patient is hypotensive, she has not yet had her blood pressure medications today.  Past Medical History:  Diagnosis Date  . Back pain   . Chronic headaches   . Hyperlipidemia   . Hypertension   . Sarcoidosis (HCC)   . Ulcerative colitis (HCC)   . Vitamin D deficiency     Patient Active Problem List   Diagnosis Date Noted  . Sarcoidosis (HCC) 05/23/2015  . Dyspnea on exertion 05/23/2015    Past Surgical History:    Procedure Laterality Date  . APPENDECTOMY    . KNEE SURGERY    . SHOULDER SURGERY    . TONSILLECTOMY      OB History    No data available       Home Medications    Prior to Admission medications   Medication Sig Start Date End Date Taking? Authorizing Provider  amLODipine (NORVASC) 5 MG tablet Take 5 mg by mouth daily.    Historical Provider, MD  carvedilol (COREG) 6.25 MG tablet Take 6.25 mg by mouth 2 (two) times daily with a meal.    Historical Provider, MD  citalopram (CELEXA) 20 MG tablet Take 30 mg by mouth daily.     Historical Provider, MD  cloNIDine (CATAPRES) 0.1 MG tablet Take 0.1 mg by mouth 2 (two) times daily.    Historical Provider, MD  famotidine (PEPCID) 40 MG tablet Take 40 mg by mouth daily.    Historical Provider, MD  fenofibrate 160 MG tablet Take 160 mg by mouth daily.    Historical Provider, MD  HYDROcodone-acetaminophen (NORCO) 10-325 MG per tablet Take 1 tablet by mouth 3 (three) times daily as needed.     Historical Provider, MD  LORazepam (ATIVAN) 1 MG tablet Take 1 mg by mouth 2 (two) times daily as needed for anxiety.    Historical Provider, MD  metoCLOPramide (REGLAN) 10 MG tablet Take 10 mg by mouth 3 (three) times daily before meals.  Historical Provider, MD  omeprazole (PRILOSEC) 40 MG capsule Take 40 mg by mouth daily.    Historical Provider, MD  potassium chloride (KLOR-CON 10) 10 MEQ tablet Take 10 mEq by mouth 2 (two) times daily.    Historical Provider, MD    Family History Family History  Problem Relation Age of Onset  . Hyperlipidemia Mother   . Hypertension Mother   . Heart disease Brother     Social History Social History  Substance Use Topics  . Smoking status: Former Smoker    Quit date: 06/08/1998  . Smokeless tobacco: Never Used  . Alcohol use No     Comment: past occasional marijuana use ( last used in the 1990's)     Allergies   Sulfur   Review of Systems Review of Systems  Musculoskeletal: Positive for  arthralgias.  All other systems reviewed and are negative.    Physical Exam Updated Vital Signs BP 170/70   Pulse 76   Temp 98.2 F (36.8 C) (Oral)   Resp 18   SpO2 98%   Physical Exam  Constitutional: She is oriented to person, place, and time. She appears well-developed and well-nourished.  HENT:  Head: Normocephalic and atraumatic.  Mouth/Throat: Oropharynx is clear and moist.  Lower lip with abrasion noted to central aspect; no active bleeding; lower front teeth have been knocked out with small pieces remaining in the gums; right upper lateral incisor with chip in tooth, feels loose on palpation; airway clear; mid-face stable without bony deformities  Eyes: Conjunctivae and EOM are normal. Pupils are equal, round, and reactive to light.  Neck: Normal range of motion.  Cardiovascular: Normal rate, regular rhythm and normal heart sounds.   Pulmonary/Chest: Effort normal and breath sounds normal.  Abdominal: Soft. Bowel sounds are normal.  Musculoskeletal: Normal range of motion.  Abrasions noted to left anterior knee; mild swelling; no gross deformity Pelvis stable and non-tender; no leg shortening  Neurological: She is alert and oriented to person, place, and time.  AAOx3, answering questions and following commands appropriately; equal strength UE and LE bilaterally; CN grossly intact; moves all extremities appropriately without ataxia; no focal neuro deficits or facial asymmetry appreciated, gait steady unassisted  Skin: Skin is warm and dry.  Psychiatric: She has a normal mood and affect.  Nursing note and vitals reviewed.    ED Treatments / Results  Labs (all labs ordered are listed, but only abnormal results are displayed) Labs Reviewed  CBC WITH DIFFERENTIAL/PLATELET - Abnormal; Notable for the following:       Result Value   WBC 10.9 (*)    RBC 5.21 (*)    Hemoglobin 11.7 (*)    MCV 72.7 (*)    MCH 22.5 (*)    RDW 18.5 (*)    Eosinophils Absolute 1.2 (*)     All other components within normal limits  BASIC METABOLIC PANEL  I-STAT TROPOININ, ED    EKG  EKG Interpretation None       Radiology Ct Head Wo Contrast  Result Date: 08/22/2016 CLINICAL DATA:  Syncopal episode today with fall and facial pain and missing teeth EXAM: CT HEAD WITHOUT CONTRAST CT MAXILLOFACIAL WITHOUT CONTRAST CT CERVICAL SPINE WITHOUT CONTRAST TECHNIQUE: Multidetector CT imaging of the head, cervical spine, and maxillofacial structures were performed using the standard protocol without intravenous contrast. Multiplanar CT image reconstructions of the cervical spine and maxillofacial structures were also generated. COMPARISON:  None. FINDINGS: CT HEAD FINDINGS Brain: No evidence of acute infarction, hemorrhage,  hydrocephalus, extra-axial collection or mass lesion/mass effect. Mild atrophic changes are noted. Vascular: No hyperdense vessel or unexpected calcification. Skull: Normal. Negative for fracture or focal lesion. Sinuses/Orbits: Mild mucosal changes are noted within the ethmoid sinuses. Small air-fluid levels are noted within the maxillary antra bilaterally. \ CT MAXILLOFACIAL FINDINGS Osseous: Some motion artifact limits evaluation somewhat. No acute fracture is identified. Scatter artifact from dental hardware limits dental evaluation. Orbits: Within normal limits. Sinuses: Mild mucosal thickening is noted within the ethmoid sinuses. Small air-fluid levels are noted within the maxillary antra. A mucosal retention cyst is noted within the right maxillary antrum. Soft tissues: Within normal limits. Limited intracranial: Within normal limits. CT CERVICAL SPINE FINDINGS Alignment: Well-maintained. Skull base and vertebrae: 7 cervical segments are well visualized. Vertebral body height is well maintained. Craniocervical junction is within normal limits. Mild degenerative changes of the articulation of C1 and the odontoid are seen. No acute fracture is identified. Soft tissues and  spinal canal: Within normal limits. Disc levels:  Mild disc space narrowing is noted at C6-C7. Upper chest: Visualized lung apices are within normal limits. IMPRESSION: CT of the head: Mild atrophic changes without acute abnormality. CT of maxillofacial bones: No acute fracture is seen. Some limitations due to patient motion artifact and dental artifact are noted. Mild changes are noted as described in the ethmoid and maxillary sinuses. CT of the cervical spine: Mild degenerative change without acute abnormality. Electronically Signed   By: Alcide Clever M.D.   On: 08/22/2016 12:18   Ct Cervical Spine Wo Contrast  Result Date: 08/22/2016 CLINICAL DATA:  Syncopal episode today with fall and facial pain and missing teeth EXAM: CT HEAD WITHOUT CONTRAST CT MAXILLOFACIAL WITHOUT CONTRAST CT CERVICAL SPINE WITHOUT CONTRAST TECHNIQUE: Multidetector CT imaging of the head, cervical spine, and maxillofacial structures were performed using the standard protocol without intravenous contrast. Multiplanar CT image reconstructions of the cervical spine and maxillofacial structures were also generated. COMPARISON:  None. FINDINGS: CT HEAD FINDINGS Brain: No evidence of acute infarction, hemorrhage, hydrocephalus, extra-axial collection or mass lesion/mass effect. Mild atrophic changes are noted. Vascular: No hyperdense vessel or unexpected calcification. Skull: Normal. Negative for fracture or focal lesion. Sinuses/Orbits: Mild mucosal changes are noted within the ethmoid sinuses. Small air-fluid levels are noted within the maxillary antra bilaterally. \ CT MAXILLOFACIAL FINDINGS Osseous: Some motion artifact limits evaluation somewhat. No acute fracture is identified. Scatter artifact from dental hardware limits dental evaluation. Orbits: Within normal limits. Sinuses: Mild mucosal thickening is noted within the ethmoid sinuses. Small air-fluid levels are noted within the maxillary antra. A mucosal retention cyst is noted  within the right maxillary antrum. Soft tissues: Within normal limits. Limited intracranial: Within normal limits. CT CERVICAL SPINE FINDINGS Alignment: Well-maintained. Skull base and vertebrae: 7 cervical segments are well visualized. Vertebral body height is well maintained. Craniocervical junction is within normal limits. Mild degenerative changes of the articulation of C1 and the odontoid are seen. No acute fracture is identified. Soft tissues and spinal canal: Within normal limits. Disc levels:  Mild disc space narrowing is noted at C6-C7. Upper chest: Visualized lung apices are within normal limits. IMPRESSION: CT of the head: Mild atrophic changes without acute abnormality. CT of maxillofacial bones: No acute fracture is seen. Some limitations due to patient motion artifact and dental artifact are noted. Mild changes are noted as described in the ethmoid and maxillary sinuses. CT of the cervical spine: Mild degenerative change without acute abnormality. Electronically Signed   By: Alcide Clever  M.D.   On: 08/22/2016 12:18   Dg Knee Complete 4 Views Left  Result Date: 08/22/2016 CLINICAL DATA:  Fall down steps today. EXAM: LEFT KNEE - COMPLETE 4+ VIEW COMPARISON:  None. FINDINGS: No evidence of fracture, dislocation, or joint effusion. No evidence of arthropathy or other focal bone abnormality. Soft tissues are unremarkable. IMPRESSION: Negative. Electronically Signed   By: Marlan Palauharles  Clark M.D.   On: 08/22/2016 13:09   Ct Maxillofacial Wo Contrast  Result Date: 08/22/2016 CLINICAL DATA:  Syncopal episode today with fall and facial pain and missing teeth EXAM: CT HEAD WITHOUT CONTRAST CT MAXILLOFACIAL WITHOUT CONTRAST CT CERVICAL SPINE WITHOUT CONTRAST TECHNIQUE: Multidetector CT imaging of the head, cervical spine, and maxillofacial structures were performed using the standard protocol without intravenous contrast. Multiplanar CT image reconstructions of the cervical spine and maxillofacial structures  were also generated. COMPARISON:  None. FINDINGS: CT HEAD FINDINGS Brain: No evidence of acute infarction, hemorrhage, hydrocephalus, extra-axial collection or mass lesion/mass effect. Mild atrophic changes are noted. Vascular: No hyperdense vessel or unexpected calcification. Skull: Normal. Negative for fracture or focal lesion. Sinuses/Orbits: Mild mucosal changes are noted within the ethmoid sinuses. Small air-fluid levels are noted within the maxillary antra bilaterally. \ CT MAXILLOFACIAL FINDINGS Osseous: Some motion artifact limits evaluation somewhat. No acute fracture is identified. Scatter artifact from dental hardware limits dental evaluation. Orbits: Within normal limits. Sinuses: Mild mucosal thickening is noted within the ethmoid sinuses. Small air-fluid levels are noted within the maxillary antra. A mucosal retention cyst is noted within the right maxillary antrum. Soft tissues: Within normal limits. Limited intracranial: Within normal limits. CT CERVICAL SPINE FINDINGS Alignment: Well-maintained. Skull base and vertebrae: 7 cervical segments are well visualized. Vertebral body height is well maintained. Craniocervical junction is within normal limits. Mild degenerative changes of the articulation of C1 and the odontoid are seen. No acute fracture is identified. Soft tissues and spinal canal: Within normal limits. Disc levels:  Mild disc space narrowing is noted at C6-C7. Upper chest: Visualized lung apices are within normal limits. IMPRESSION: CT of the head: Mild atrophic changes without acute abnormality. CT of maxillofacial bones: No acute fracture is seen. Some limitations due to patient motion artifact and dental artifact are noted. Mild changes are noted as described in the ethmoid and maxillary sinuses. CT of the cervical spine: Mild degenerative change without acute abnormality. Electronically Signed   By: Alcide CleverMark  Lukens M.D.   On: 08/22/2016 12:18    Procedures Procedures (including  critical care time)  Medications Ordered in ED Medications  Tdap (BOOSTRIX) injection 0.5 mL (not administered)  amLODipine (NORVASC) tablet 5 mg (5 mg Oral Given 08/22/16 1444)  meclizine (ANTIVERT) tablet 25 mg (25 mg Oral Given 08/22/16 1444)     Initial Impression / Assessment and Plan / ED Course  I have reviewed the triage vital signs and the nursing notes.  Pertinent labs & imaging results that were available during my care of the patient were reviewed by me and considered in my medical decision making (see chart for details).  Clinical Course   65 year old female here after a mechanical fall down her steps wearing slide on sandals. She did have head injury without loss of consciousness. Patient is awake, alert, appropriately oriented here. She has no focal neurologic deficits. She does have apparent facial trauma including abrasions to her lower lip, missing lower teeth, and loose right upper tooth.  CT of her head, face, and neck are negative for any acute injuries. Plain films of  the left knee are also negative. Tetanus updated here.  Patient did report some dizziness upon arriving to the ED, she has a history of chronic vertigo and has been having intermittent issues with this lately. She denies any feelings of dizziness or syncope prior to her fall. EKG non-ischemic.  Screening lab work here is negative for any acute findings. She was hypertensive but is not yet had any of her medications today. She was given dose of her home meds with improvement here. Dizziness improved with meclizine.  Patient remains neurologically intact to her baseline. She's been ambulatory in the ED with a steady gait. Dizziness seems peripheral and likely related to her vertigo-- feel acute CVA less likely.  Feel patient is stable for discharge.  Knee sleeve given for comfort.  Small supply pain meds given her oral trauma.  Also given script for meclizine as she repots chronic vertigo.  Will follow-up with her  primary care doctor as well as her dentist.  Discussed plan with patient, she acknowledged understanding and agreed with plan of care.  Return precautions given for new or worsening symptoms.  Case discussed with attending physician, Dr. Estell Harpin, who agrees with assessment and plan of care.  Final Clinical Impressions(s) / ED Diagnoses   Final diagnoses:  Fall, initial encounter  Dental injury, initial encounter  Dizziness    New Prescriptions New Prescriptions   HYDROCODONE-ACETAMINOPHEN (NORCO/VICODIN) 5-325 MG TABLET    Take 1 tablet by mouth every 4 (four) hours as needed.   MECLIZINE (ANTIVERT) 25 MG TABLET    Take 1 tablet (25 mg total) by mouth 3 (three) times daily as needed for dizziness.   NAPROXEN (NAPROSYN) 500 MG TABLET    Take 1 tablet (500 mg total) by mouth 2 (two) times daily with a meal.     Garlon Hatchet, PA-C 08/22/16 1559    Bethann Berkshire, MD 08/24/16 [phone]

## 2016-08-22 NOTE — ED Notes (Signed)
Cervical collar applied in triage.

## 2016-08-22 NOTE — ED Notes (Signed)
Pt verbalized understanding of d/c instructions and has no further questions. Pt stable and NAD. Pt d/c home with daugter driving

## 2016-08-22 NOTE — Discharge Instructions (Signed)
Your lab work today was reassuring. Continue taking your blood pressure medications as directed. Use pain medication as needed for pain control. Take meclizine as needed for your vertigo symptoms. Follow-up with your primary care doctor. Also recommend to follow-up with your dentist evaluate her teeth. Return here for any new or worsening symptoms.

## 2016-08-22 NOTE — ED Notes (Signed)
Called patient to recheck vitals pt family stated that she was in x ray

## 2016-08-22 NOTE — ED Notes (Signed)
Pt. Went to x-ray and informed them that her rt. Elbow and her lt. Wrist is feeling better.    Will cancel the x-rays

## 2016-08-22 NOTE — ED Notes (Signed)
c collar in place

## 2017-10-04 IMAGING — CT CT CERVICAL SPINE W/O CM
3 of 11 series · 7 of 34 positions shown, 8 images · non-contrast
Comparison: None.

CLINICAL DATA: Syncopal episode today with fall and facial pain and
missing teeth

EXAM:
CT HEAD WITHOUT CONTRAST
CT MAXILLOFACIAL WITHOUT CONTRAST
CT CERVICAL SPINE WITHOUT CONTRAST
TECHNIQUE: Multidetector CT imaging of the head, cervical spine, and
maxillofacial structures were performed using the standard protocol
without intravenous contrast. Multiplanar CT image reconstructions
of the cervical spine and maxillofacial structures were also
generated.

[Series 7: facialbone 2.0 st · axial · 0.39mm/px · z∈[-195,-129]mm · 2 of 101 slices shown, 3 images]
[im 34/101  soft-tissue]
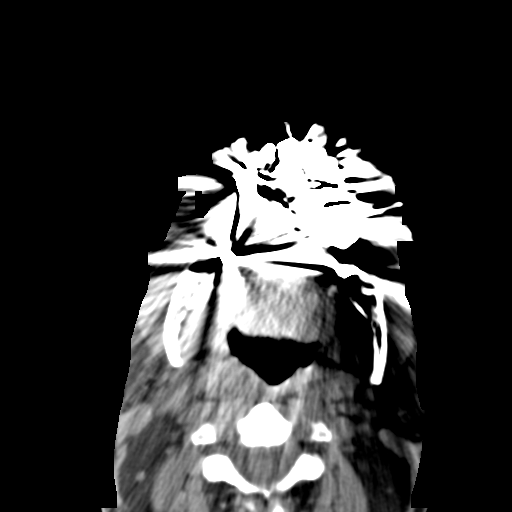
[im 34/101  bone]
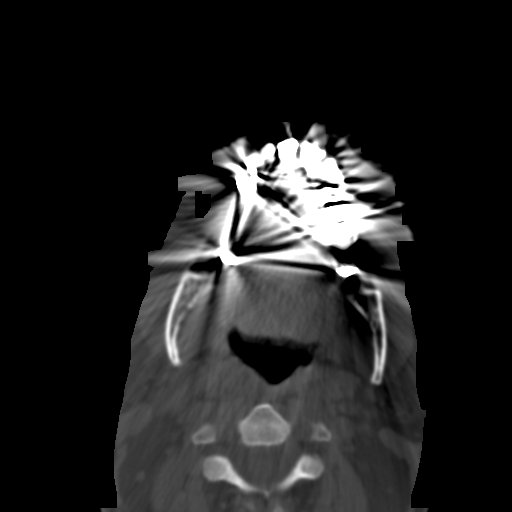
[im 67/101  bone]
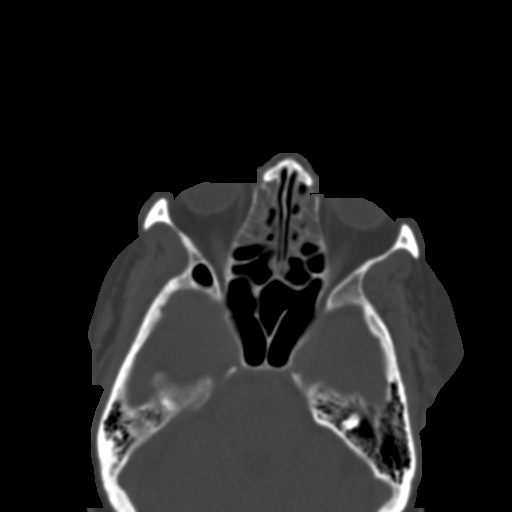

[Series 11: facialbone 2.0 cor st · coronal · 0.38mm/px · 1 of 87 slices shown]
[im 44/87  bone]
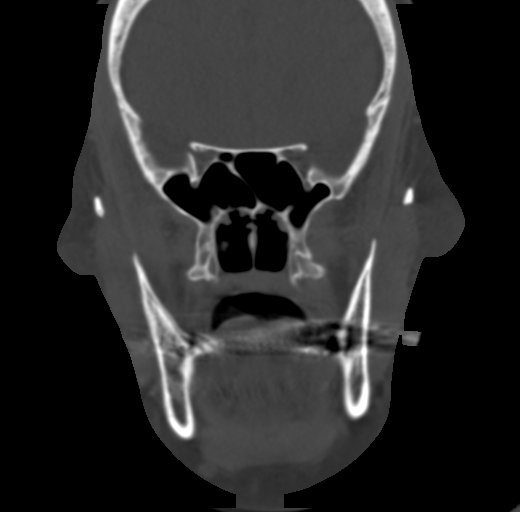

[Series 12: facialbone 2.0 sag st · sagittal · 0.32mm/px · 4 of 96 slices shown]
[im 24/96  bone]
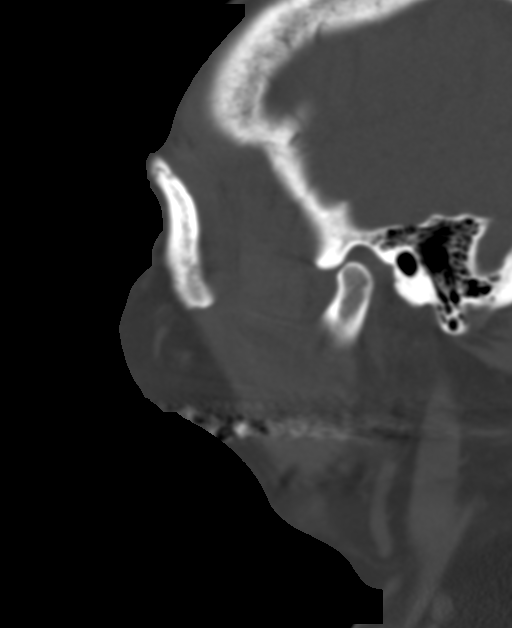
[im 48/96  bone]
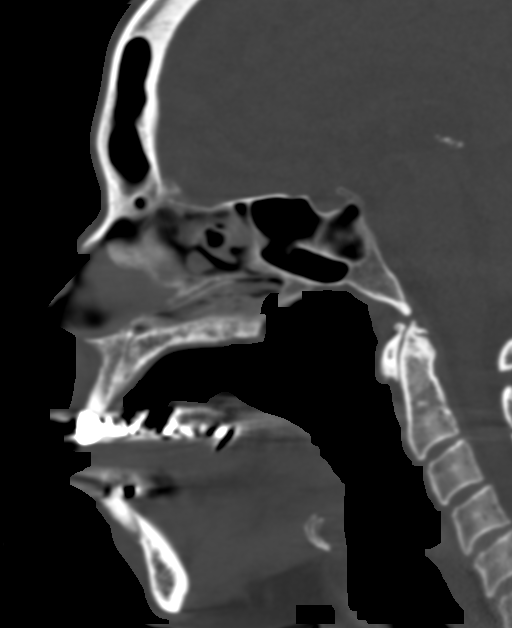
[im 72/96  bone]
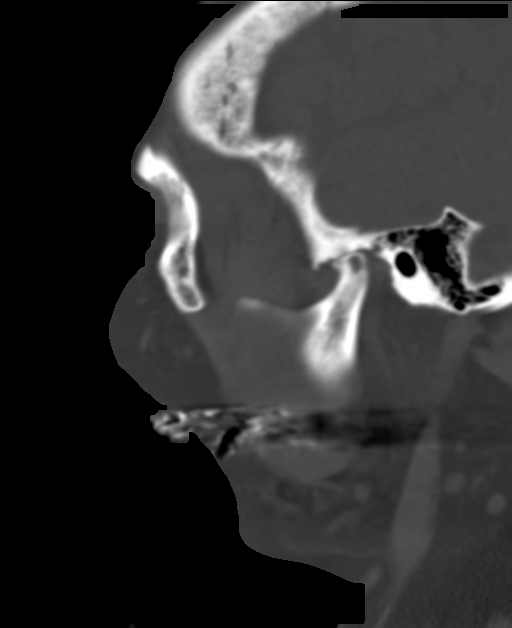
[im 74/96  soft-tissue]
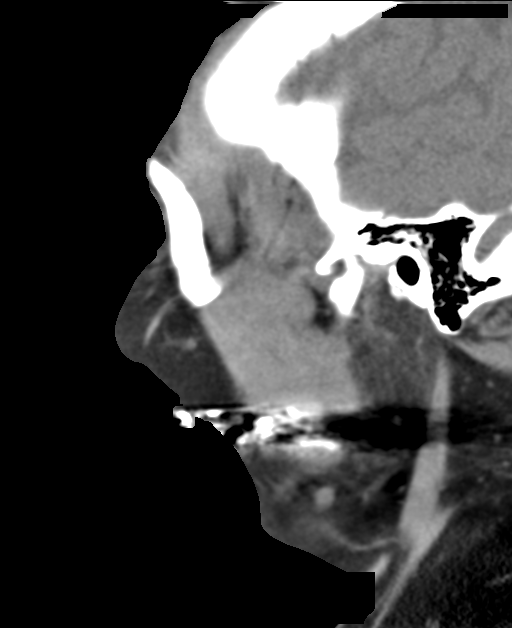

[7 of 34 positions shown; findings below may reference images not displayed]

FINDINGS: CT HEAD FINDINGS

Brain: No evidence of acute infarction, hemorrhage, hydrocephalus,
extra-axial collection or mass lesion/mass effect. Mild atrophic
changes are noted.

Vascular: No hyperdense vessel or unexpected calcification.

Skull: Normal. Negative for fracture or focal lesion.

Sinuses/Orbits: Mild mucosal changes are noted within the ethmoid
sinuses. Small air-fluid levels are noted within the maxillary antra
bilaterally.

CT MAXILLOFACIAL FINDINGS

Osseous: Some motion artifact limits evaluation somewhat. No acute
fracture is identified. Scatter artifact from dental hardware limits
dental evaluation.

Orbits: Within normal limits.

Sinuses: Mild mucosal thickening is noted within the ethmoid
sinuses. Small air-fluid levels are noted within the maxillary
antra. A mucosal retention cyst is noted within the right maxillary
antrum.

Soft tissues: Within normal limits.

Limited intracranial: Within normal limits.

CT CERVICAL SPINE FINDINGS

Alignment: Well-maintained.

Skull base and vertebrae: 7 cervical segments are well visualized.
Vertebral body height is well maintained. Craniocervical junction is
within normal limits. Mild degenerative changes of the articulation
of C1 and the odontoid are seen. No acute fracture is identified.

Soft tissues and spinal canal: Within normal limits.

Disc levels:  Mild disc space narrowing is noted at C6-C7.

Upper chest: Visualized lung apices are within normal limits.
IMPRESSION: CT of the head: Mild atrophic changes without acute abnormality.

CT of maxillofacial bones: No acute fracture is seen. Some
limitations due to patient motion artifact and dental artifact are
noted. Mild changes are noted as described in the ethmoid and
maxillary sinuses.

CT of the cervical spine: Mild degenerative change without acute
abnormality.

## 2018-11-04 ENCOUNTER — Other Ambulatory Visit: Payer: Self-pay

## 2018-11-04 ENCOUNTER — Emergency Department (HOSPITAL_COMMUNITY)
Admission: EM | Admit: 2018-11-04 | Discharge: 2018-11-04 | Disposition: A | Discharge status: Home / Self Care | Payer: Medicare HMO | Attending: Emergency Medicine | Admitting: Emergency Medicine

## 2018-11-04 ENCOUNTER — Encounter (HOSPITAL_COMMUNITY): Payer: Self-pay

## 2018-11-04 DIAGNOSIS — I10 Essential (primary) hypertension: Secondary | ICD-10-CM | POA: Insufficient documentation

## 2018-11-04 DIAGNOSIS — M25511 Pain in right shoulder: Secondary | ICD-10-CM | POA: Diagnosis present

## 2018-11-04 DIAGNOSIS — Z87891 Personal history of nicotine dependence: Secondary | ICD-10-CM | POA: Insufficient documentation

## 2018-11-04 DIAGNOSIS — Z79899 Other long term (current) drug therapy: Secondary | ICD-10-CM | POA: Diagnosis not present

## 2018-11-04 DIAGNOSIS — G8929 Other chronic pain: Secondary | ICD-10-CM | POA: Insufficient documentation

## 2018-11-04 DIAGNOSIS — R200 Anesthesia of skin: Secondary | ICD-10-CM | POA: Insufficient documentation

## 2018-11-04 NOTE — ED Provider Notes (Signed)
[facility] EMERGENCY DEPARTMENT Provider Note   CSN: [id number] Arrival date & time: 11/04/18  7829     History   Chief Complaint Chief Complaint  Patient presents with  . Shoulder Pain    HPI Erika Cherry is a 67 y.o. female.  67 year old female with past medical history below who presents with right shoulder pain.  The patient has a history of right shoulder problems and had surgery many years ago.  He was doing okay until about 4 months ago when she began having problems with pain again.  Pain is worse with any shoulder movements.  She saw her PCP over the past month and had 2 sequential steroid injections without much relief.  She was referred back to her orthopedic surgeon and has an appointment next week but her pain became more severe and she felt like she could not wait.  She recently over the past few weeks has had intermittent feelings of numbness going down to her hand.  She denies any weakness of her hand and symptoms are minimal when she is not moving her shoulder.  She denies any associated neck pain. No fevers. No recent trauma or physical activity. She takes NSAIDs and hydrocodone at home without much relief.   The history is provided by the patient.  Shoulder Pain   Associated symptoms include numbness.    Past Medical History:  Diagnosis Date  . Back pain   . Chronic headaches   . Hyperlipidemia   . Hypertension   . Sarcoidosis   . Ulcerative colitis (HCC)   . Vitamin D deficiency     Patient Active Problem List   Diagnosis Date Noted  . Sarcoidosis 05/23/2015  . Dyspnea on exertion 05/23/2015    Past Surgical History:  Procedure Laterality Date  . APPENDECTOMY    . KNEE SURGERY    . SHOULDER SURGERY    . TONSILLECTOMY       OB History   None      Home Medications    Prior to Admission medications   Medication Sig Start Date End Date Taking? Authorizing Provider  amLODipine (NORVASC) 5 MG tablet Take 5 mg by mouth  daily.    [provider]  carvedilol (COREG) 12.5 MG tablet Take 12.5 mg by mouth 2 (two) times daily with a meal.     [provider]  Cholecalciferol (VITAMIN D3) 2000 units TABS Take 2,000 Units by mouth daily.    [provider]  citalopram (CELEXA) 20 MG tablet Take 30 mg by mouth daily.     [provider]  cloNIDine (CATAPRES) 0.2 MG tablet Take 0.2 mg by mouth 2 (two) times daily.     [provider]  famotidine (PEPCID) 40 MG tablet Take 40 mg by mouth daily.    [provider]  Ginkgo Biloba (GNP GINGKO BILOBA EXTRACT PO) Take 1 capsule by mouth daily.    [provider]  HYDROcodone-acetaminophen (NORCO/VICODIN) 5-325 MG tablet Take 1 tablet by mouth every 4 (four) hours as needed. 08/22/16   Garlon Hatchet, PA-C  LORazepam (ATIVAN) 1 MG tablet Take 1 mg by mouth at bedtime.     [provider]  meclizine (ANTIVERT) 25 MG tablet Take 1 tablet (25 mg total) by mouth 3 (three) times daily as needed for dizziness. 08/22/16   Garlon Hatchet, PA-C  metoCLOPramide (REGLAN) 10 MG tablet Take 5-10 mg by mouth 2 (two) times daily. Take 1 tablet in the morning,  and 1/2 tablet at bedtime    [provider]  Multiple Vitamins-Minerals (VITRUM 50+ SENIOR MULTI PO) Take 1 tablet by mouth daily.    [provider]  naproxen (NAPROSYN) 500 MG tablet Take 1 tablet (500 mg total) by mouth 2 (two) times daily with a meal. 08/22/16   Garlon Hatchet, PA-C  omeprazole (PRILOSEC) 40 MG capsule Take 40 mg by mouth every evening.     [provider]  potassium chloride (KLOR-CON 10) 10 MEQ tablet Take 40-50 mEq by mouth daily. Take 5 tablets on Mon, Wed, and Fri. Take 4 tablets all others day.    [provider]  rosuvastatin (CRESTOR) 5 MG tablet Take 5 mg by mouth every morning. 07/04/16   [provider]    Family History Family History  Problem Relation Age of Onset  . Hyperlipidemia Mother     . Hypertension Mother   . Heart disease Brother     Social History Social History   Tobacco Use  . Smoking status: Former Smoker    Last attempt to quit: 06/08/1998    Years since quitting: 20.4  . Smokeless tobacco: Never Used  Substance Use Topics  . Alcohol use: No    Comment: past occasional marijuana use ( last used in the 1990's)  . Drug use: No     Allergies   Sulfur   Review of Systems Review of Systems  Constitutional: Negative for fever.  Musculoskeletal: Positive for arthralgias. Negative for joint swelling.  Skin: Negative for wound.  Neurological: Positive for numbness.     Physical Exam Updated Vital Signs BP (!) 163/66 (BP Location: Right Arm)   Pulse (!) 59   Temp 98.3 F (36.8 C) (Oral)   Resp 15   Ht 5\' 8"  (1.727 m)   SpO2 98%   BMI 31.02 kg/m   Physical Exam  Constitutional: She is oriented to person, place, and time. She appears well-developed and well-nourished. No distress.  HENT:  Head: Normocephalic and atraumatic.  Eyes: Conjunctivae are normal.  Neck: Neck supple.  Cardiovascular: Intact distal pulses.  Musculoskeletal: Normal range of motion. She exhibits tenderness. She exhibits no edema.  Tenderness of anterior and posterior R shoulder with no obvious swelling. No midline c-spine tenderness. Limited ROM shoulder 2/2 pain, pain when raising above 45 degrees. Normal ROM elbow and wrist; normal grip, biceps, and triceps strength  Neurological: She is alert and oriented to person, place, and time. No sensory deficit.  Skin: Skin is warm and dry. No rash noted.  Psychiatric: She has a normal mood and affect. Judgment normal.  Nursing note and vitals reviewed.    ED Treatments / Results  Labs (all labs ordered are listed, but only abnormal results are displayed) Labs Reviewed - No data to display  EKG None  Radiology No results found.  Procedures Procedures (including critical care time)  Medications Ordered in  ED Medications - No data to display   Initial Impression / Assessment and Plan / ED Course  I have reviewed the triage vital signs and the nursing notes.      Pt's pain seems to be originating from her shoulder and she is not having any c-spine sx to suggest origination from disc herniation.  She is neurologically intact distally and no weakness or numbness on my exam.  I have discussed supportive measures and discussed limitations in activities to try to prevent any observation of her current symptoms.  She has gabapentin at home but  does not currently take it.  I encouraged her to start taking this medication along with other medications.  She already has follow-up scheduled with her orthopedist.  She had plain films recently therefore I do not feel repeat x-rays are of benefit today.  No indication for MRI at this point.  I have extensively reviewed return precautions and she and daughter voiced understanding.  Final Clinical Impressions(s) / ED Diagnoses   Final diagnoses:  Chronic right shoulder pain    ED Discharge Orders    None       , Ambrose Finlandachel Morgan, MD 11/04/18 [phone]

## 2018-11-04 NOTE — ED Triage Notes (Signed)
Pt reports back pain and numbness in hands bilaterally, ongoing problem. Has seen PCP and had cortisone shot with persistent sx.

## 2018-11-04 NOTE — ED Triage Notes (Signed)
Pt c/o right shoulder pain. Pt has had numbness that started yesterday. Pt has hx of rotator cuff surgery on shoulder. Pt denies chest pain, SOB, or weakness.

## 2018-12-21 ENCOUNTER — Ambulatory Visit: Payer: Self-pay | Admitting: Physical Therapy

## 2018-12-31 ENCOUNTER — Other Ambulatory Visit: Payer: Self-pay

## 2018-12-31 ENCOUNTER — Ambulatory Visit: Payer: Medicare HMO | Attending: Physician Assistant | Admitting: Physical Therapy

## 2018-12-31 ENCOUNTER — Encounter: Payer: Self-pay | Admitting: Physical Therapy

## 2018-12-31 DIAGNOSIS — M25511 Pain in right shoulder: Secondary | ICD-10-CM

## 2018-12-31 DIAGNOSIS — M6281 Muscle weakness (generalized): Secondary | ICD-10-CM

## 2018-12-31 DIAGNOSIS — M25611 Stiffness of right shoulder, not elsewhere classified: Secondary | ICD-10-CM

## 2018-12-31 NOTE — Therapy (Signed)
[facility] [address] [place], [place], [address] Phone: [phone]   Fax:  [phone]  Physical Therapy Evaluation  Patient Details  Name: Erika Cherry MRN: [id number] Date of Birth: 09-Nov-1951 Referring Provider (PT): Standley Dakins, [place]   Encounter Date: 12/31/2018  PT End of Session - 12/31/18 2137    Visit Number  1    Number of Visits  8    Date for PT Re-Evaluation  02/04/19    PT Start Time  1035    PT Stop Time  1116    PT Time Calculation (min)  41 min    Activity Tolerance  Patient limited by pain       Past Medical History:  Diagnosis Date  . Back pain   . Chronic headaches   . Hyperlipidemia   . Hypertension   . Sarcoidosis   . Ulcerative colitis (HCC)   . Vitamin D deficiency     Past Surgical History:  Procedure Laterality Date  . APPENDECTOMY    . KNEE SURGERY    . SHOULDER SURGERY    . TONSILLECTOMY      There were no vitals filed for this visit.   Subjective Assessment - 12/31/18 2134    Subjective  Patient arrives to physical therapy with reports of right shoulder pain that began due to an unknown cause. Patient reported she has a history of a right rotator cuff repair 23+ years ago. Patient reports most difficulties with reaching overhead to wash hair and while performing any household chores. Patient reports her pain limits her ability to perform ADLs and household activities therefore her daughter assists when necessary. Patient reports intermittent numbing to right with various activities. Per patient's daughter, MD reported she is not a candidate for surgery at this time. Patient reports pain at worst is 7/10 and describes her pain as sore. Patient's pain at best is 0/10 while at rest and not moving the arm. Patient's goals for physical therapy are to decrease pain, improve movement, improve strength and have less difficulties with home activities.    Patient is accompained by:  Family member    Daughter   Pertinent History  HTN, cognitive deficits per daughter report    Limitations  Lifting;House hold activities    Diagnostic tests  MRI, X-Ray: "tear"    Patient Stated Goals  decrease pain, wash hair and do activities without pain    Currently in Pain?  Yes    Pain Score  4     Pain Location  Shoulder    Pain Orientation  Right    Pain Descriptors / Indicators  Discomfort    Pain Type  Chronic pain    Pain Onset  More than a month ago    Pain Frequency  Intermittent    Aggravating Factors   "moving"    Pain Relieving Factors  "not moving"    Effect of Pain on Daily Activities  "Can't wash hair or do any activities such as cleaning"         [facility] PT Assessment - 12/31/18 0001      Assessment   Medical Diagnosis  Pain in right shoulder    Referring Provider (PT)  Standley Dakins, PA-C    Onset Date/Surgical Date  08/16/18    Hand Dominance  Right    Next MD Visit  unsure    Prior Therapy  no      Precautions   Precautions  None  Restrictions   Weight Bearing Restrictions  No      Balance Screen   Has the patient fallen in the past 6 months  No    Has the patient had a decrease in activity level because of a fear of falling?   No    Is the patient reluctant to leave their home because of a fear of falling?   No      Home Environment   Living Environment  Private residence    Living Arrangements  Children      Prior Function   Level of Independence  Independent with basic ADLs      Posture/Postural Control   Posture/Postural Control  Postural limitations    Postural Limitations  Rounded Shoulders;Forward head      ROM / Strength   AROM / PROM / Strength  AROM;PROM;Strength      AROM   Overall AROM   Deficits;Due to pain    Overall AROM Comments  L shoulder WFL    AROM Assessment Site  Shoulder    Right/Left Shoulder  Right    Right Shoulder Flexion  80 Degrees    Right Shoulder ABduction  59 Degrees    Right Shoulder Internal Rotation  64 Degrees     Right Shoulder External Rotation  40 Degrees      PROM   Overall PROM   Deficits;Due to pain    PROM Assessment Site  Shoulder    Right/Left Shoulder  Right    Right Shoulder Flexion  88 Degrees    Right Shoulder ABduction  69 Degrees    Right Shoulder Internal Rotation  69 Degrees    Right Shoulder External Rotation  43 Degrees      Strength   Overall Strength  Deficits;Due to pain    Strength Assessment Site  Shoulder    Right/Left Shoulder  Right    Right Shoulder Flexion  2+/5    Right Shoulder ABduction  2+/5    Right Shoulder Internal Rotation  3-/5    Right Shoulder External Rotation  3-/5      Palpation   Palpation comment  Very tender to palpation to anterior shoulder, posterolateral right shoulder and right UT      Special Tests    Special Tests  Rotator Cuff Impingement    Rotator Cuff Impingment tests  Leanord AsalHawkins- Kennedy test      Hawkins-Kennedy test   Findings  Positive    Side  Right      Transfers   Transfers  Independent with all Transfers                Objective measurements completed on examination: See above findings.              PT Education - 12/31/18 2137    Education Details  scapular retractions, chin tucks, pendulums    Person(s) Educated  Patient    Methods  Explanation;Handout;Demonstration    Comprehension  Verbalized understanding;Returned demonstration          PT Long Term Goals - 12/31/18 2140      PT LONG TERM GOAL #1   Title  Patient will be independent with HEP    Time  4    Period  Weeks    Status  New      PT LONG TERM GOAL #2   Title  Patient will demonstrate 110+ degrees of right shoulder flexion AROM to improve ability to perform functional tasks.  Time  4    Period  Weeks    Status  New      PT LONG TERM GOAL #3   Title  Patient will demonstrate 3+/5 or greater right shoulder MMT in all planes to improve stability during functional tasks.     Time  4    Period  Weeks    Status  New       PT LONG TERM GOAL #4   Title  Patient will demonstrate functional behind the head ER to occiput to improve ability to wash her hair.    Time  4    Period  Weeks    Status  New      PT LONG TERM GOAL #5   Title  Patient will report ability to perform ADLs and home chores with right shoulder pain less than or equal to 4/10    Time  4    Period  Weeks    Status  New             Plan - 12/31/18 2138    Clinical Impression Statement  Patient is a 68 year old female who presents to physical therapy with right shoulder pain, decreased right shoulder ROM in all planes, and decreased right shoulder strength. Patient (+) right Hawkin's Kennedy Test. Patient very tender to palpation along anterior and posteriolateral aspect of the right shoulder. Patient demonstrates rounded shoulders, and forward head in sitting. Patient would benefit from skilled physical therapy to address deficits and address patient's goals.     History and Personal Factors relevant to plan of care:  HTN, cognitive deficits per daughter report    Clinical Presentation  Evolving    Clinical Presentation due to:  not improving    Clinical Decision Making  Low    Rehab Potential  Good    PT Frequency  2x / week    PT Duration  4 weeks    PT Treatment/Interventions  ADLs/Self Care Home Management;Electrical Stimulation;Moist Heat;Cryotherapy;Ultrasound;Iontophoresis 4mg /ml Dexamethasone;Functional mobility training;Neuromuscular re-education;Therapeutic exercise;Therapeutic activities;Patient/family education;Manual techniques;Passive range of motion;Taping;Vasopneumatic Device    PT Next Visit Plan  gentle AAROM, pulleys, ranger if tolerated, postural exercises, modalities for pain relief    PT Home Exercise Plan  see patient education section    Consulted and Agree with Plan of Care  Patient;Family member/caregiver       Patient will benefit from skilled therapeutic intervention in order to improve the following  deficits and impairments:  Pain, Postural dysfunction, Impaired UE functional use, Decreased range of motion, Decreased endurance, Decreased strength  Visit Diagnosis: Right shoulder pain, unspecified chronicity - Plan: PT plan of care cert/re-cert  Stiffness of right shoulder, not elsewhere classified - Plan: PT plan of care cert/re-cert  Muscle weakness (generalized) - Plan: PT plan of care cert/re-cert     Problem List Patient Active Problem List   Diagnosis Date Noted  . Sarcoidosis 05/23/2015  . Dyspnea on exertion 05/23/2015   Guss BundeKrystle Tammie Ellsworth, PT, DPT 12/31/2018, 9:52 PM  [facility] [address] [place], [place], [address] Phone: [phone]   Fax:  [phone]  Name: Fontaine Noancy Yonts MRN: [id number] Date of Birth: 12/23/1950

## 2019-07-26 ENCOUNTER — Ambulatory Visit: Payer: Medicare HMO | Admitting: Neurology

## 2019-09-02 ENCOUNTER — Ambulatory Visit: Payer: Medicare HMO | Admitting: Neurology

## 2019-10-11 ENCOUNTER — Encounter: Payer: Self-pay | Admitting: Neurology

## 2019-10-11 ENCOUNTER — Ambulatory Visit: Payer: Medicare HMO | Admitting: Neurology

## 2019-10-11 ENCOUNTER — Other Ambulatory Visit: Payer: Self-pay

## 2019-10-11 VITALS — BP 167/85 | HR 86 | Temp 97.4°F | Ht 69.5 in | Wt 203.3 lb

## 2019-10-11 DIAGNOSIS — R413 Other amnesia: Secondary | ICD-10-CM

## 2019-10-11 DIAGNOSIS — E538 Deficiency of other specified B group vitamins: Secondary | ICD-10-CM

## 2019-10-11 DIAGNOSIS — G479 Sleep disorder, unspecified: Secondary | ICD-10-CM | POA: Diagnosis not present

## 2019-10-11 MED ORDER — MEMANTINE HCL 5 MG PO TABS: ORAL_TABLET | ORAL | 0 refills | Status: AC

## 2019-10-11 NOTE — Progress Notes (Signed)
Reason for visit: Memory disturbance  Referring physician: Dr. Diona FantiBulla  Erika Cherry is a 68 y.o. female  History of present illness:  Erika Cherry is a 68 year old right-handed black female with a history of a memory disturbance that began around 2013.  The patient comes in with her daughter.  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  Several of her best friends died around that time as well, since that time, the patient has not been functioning well cognitively.  She stopped driving in [id number] because she felt too nervous to do this.  She currently lives with her daughter.  Her daughter had to take over doing the finances and keep up with appointments in 2014, she also has to manage the medications for her mother, she was taking medications twice during the day.  She now has a pill dispenser.  The patient will repeat herself frequently, she denies that she misplaces things about the house.  The patient apparently snores at night and she has excessive daytime drowsiness.  She will go to bed around 10 or 11 PM and will not get up until around 1 PM the next day.  She will eat breakfast, and then she may take a nap, she may sleep 3 to 4 hours during the day before she goes to bed at night.  The patient has some occasional slight tremor involving the right hand over the last 3 years or so.  This increases with anxiety.  The patient has a history of vertigo, she does have some mild gait instability with this.  She has no numbness or weakness of the face, arms, legs.  She denies headaches.  She is on Aricept and Namenda, but the daughter believes that she is no longer getting the Namenda.  She has tolerated these drugs well.  The patient indicates that her mother's siblings had dementia, this totals up to 7 individuals.  Past Medical History:  Diagnosis Date  . Anxiety   . Back pain   . Chronic headaches   . Depression   . Gastric reflux   .  Hyperlipidemia   . Hypertension   . Sarcoidosis   . Ulcerative colitis (HCC)   . Vertigo   . Vitamin D deficiency     Past Surgical History:  Procedure Laterality Date  . APPENDECTOMY    . KNEE SURGERY    . SHOULDER SURGERY    . TONSILLECTOMY      Family History  Problem Relation Age of Onset  . Hyperlipidemia Mother   . Hypertension Mother   . Stroke Mother   . Heart attack Father   . Heart disease Brother     Social history:  reports that she quit smoking about 21 years ago. She has never used smokeless tobacco. She reports previous drug use. Drug: Marijuana. She reports that she does not drink alcohol.  Medications:  Prior to Admission medications   Medication Sig Start Date End Date Taking? Authorizing Provider  amLODipine (NORVASC) 5 MG tablet Take 5 mg by mouth daily.   Yes [provider]  aspirin EC 81 MG tablet Take 81 mg by mouth daily.   Yes [provider]  carvedilol (COREG) 12.5 MG tablet Take 12.5 mg by mouth 2 (two) times daily with a meal.    Yes [provider]  Cholecalciferol (VITAMIN D3) 2000 units TABS Take 2,000 Units by mouth daily.   Yes [provider]  citalopram (CELEXA) 20 MG tablet Take 30 mg by mouth daily.    Yes [provider]  cloNIDine (CATAPRES) 0.2 MG tablet Take 0.2 mg by mouth 2 (two) times daily.    Yes [provider]  donepezil (ARICEPT) 10 MG tablet Take 10 mg by mouth at bedtime.   Yes [provider]  famotidine (PEPCID) 40 MG tablet Take 40 mg by mouth daily.   Yes [provider]  HYDROcodone-acetaminophen (NORCO/VICODIN) 5-325 MG tablet Take 1 tablet by mouth every 4 (four) hours as needed. 08/22/16  Yes Larene Pickett, PA-C  LORazepam (ATIVAN) 1 MG tablet Take 1 mg by mouth at bedtime.    Yes [provider]  metoCLOPramide (REGLAN) 10 MG tablet Take 5-10 mg by mouth 2 (two) times daily. Take 1 tablet in the morning, and 1/2 tablet at bedtime   Yes  [provider]  Multiple Vitamins-Minerals (VITRUM 50+ SENIOR MULTI PO) Take 1 tablet by mouth daily.   Yes [provider]  naproxen (NAPROSYN) 500 MG tablet Take 1 tablet (500 mg total) by mouth 2 (two) times daily with a meal. 08/22/16  Yes Larene Pickett, PA-C  omeprazole (PRILOSEC) 40 MG capsule Take 40 mg by mouth every evening.    Yes [provider]  potassium chloride (KLOR-CON 10) 10 MEQ tablet Take 40-50 mEq by mouth daily. Take 5 tablets on Mon, Wed, and Fri. Take 4 tablets all others day.   Yes [provider]  rosuvastatin (CRESTOR) 5 MG tablet Take 5 mg by mouth every morning. 07/04/16  Yes [provider]  Ginkgo Biloba (GNP GINGKO BILOBA EXTRACT PO) Take 1 capsule by mouth daily.    [provider]  meclizine (ANTIVERT) 25 MG tablet Take 1 tablet (25 mg total) by mouth 3 (three) times daily as needed for dizziness. Patient not taking: Reported on 10/11/2019 08/22/16   Larene Pickett, PA-C  memantine (NAMENDA) 10 MG tablet Take 10 mg by mouth 2 (two) times daily.    [provider]      Allergies  Allergen Reactions  . Sulfur Itching    ROS:  Out of a complete 14 system review of symptoms, the patient complains only of the following symptoms, and all other reviewed systems are negative.  Memory problem Excessive drowsiness Anxiety  Blood pressure (!) 167/85, pulse 86, temperature (!) 97.4 F (36.3 C), temperature source Temporal, height 5' 9.5" (1.765 m), weight 203 lb 5 oz (92.2 kg).  Physical Exam  General: The patient is alert and cooperative at the time of the examination.  The patient is moderately obese, central obesity is seen.  Eyes: Pupils are equal, round, and reactive to light. Discs are flat bilaterally.  Neck: The neck is supple, no carotid bruits are noted.  Respiratory: The respiratory examination is clear.  Cardiovascular: The cardiovascular examination reveals a regular rate and  rhythm, no obvious murmurs or rubs are noted.  Skin: Extremities are without significant edema.  Neurologic Exam  Mental status: The patient is alert and oriented x 3 at the time of the examination. The Mini-Mental status examination done today shows a total score 24/30.  Cranial nerves: Facial symmetry is present. There is good sensation of the face to pinprick and soft touch bilaterally. The strength of the facial muscles and the muscles to head turning and shoulder shrug are normal bilaterally. Speech is well enunciated, no aphasia or dysarthria is noted. Extraocular movements are full. Visual fields are  full. The tongue is midline, and the patient has symmetric elevation of the soft palate. No obvious hearing deficits are noted.  Motor: The motor testing reveals 5 over 5 strength of all 4 extremities. Good symmetric motor tone is noted throughout.  Sensory: Sensory testing is intact to pinprick, soft touch, vibration sensation, and position sense on all 4 extremities. No evidence of extinction is noted.  Coordination: Cerebellar testing reveals good finger-nose-finger and heel-to-shin bilaterally.  Gait and station: Gait is normal. Tandem gait is unsteady. Romberg is negative. No drift is seen.  Reflexes: Deep tendon reflexes are symmetric, but are depressed bilaterally. Toes are downgoing bilaterally.   CT head 08/22/16:  IMPRESSION: CT of the head: Mild atrophic changes without acute abnormality.   Assessment/Plan:  1.  Memory disturbance, possible Alzheimer's disease  2.  Excessive daytime drowsiness  3.  Anxiety and depression  At the onset of her memory disturbance, the patient may have been suffering from anxiety and depression.  The patient is sleeping quite a bit during the day and at night, she may sleep greater than 15 or 16 hours a day.  She does snore at night, for this reason I will send her for a sleep evaluation.  The patient will have blood work today.  She will  go back on the [place].  She will follow-up here in about 6 months.  We will follow the memory issues over time.  Marlan Palau MD 10/11/2019 11:50 AM  [facility] [address] [place], [place] [address]  Phone [phone] Fax [phone]

## 2019-10-12 LAB — RPR: RPR Ser Ql: NONREACTIVE

## 2019-10-12 LAB — VITAMIN B12: Vitamin B-12: 554 pg/mL (ref 232–1245)

## 2019-10-12 LAB — SEDIMENTATION RATE: Sed Rate: 42 mm/hr — ABNORMAL HIGH (ref 0–40)

## 2019-11-03 ENCOUNTER — Institutional Professional Consult (permissible substitution): Payer: Medicare HMO | Admitting: Neurology

## 2020-01-14 ENCOUNTER — Ambulatory Visit: Payer: Self-pay

## 2020-01-21 ENCOUNTER — Ambulatory Visit: Payer: Self-pay

## 2020-02-18 ENCOUNTER — Ambulatory Visit: Payer: Medicare HMO | Attending: Internal Medicine

## 2020-02-18 DIAGNOSIS — Z23 Encounter for immunization: Secondary | ICD-10-CM | POA: Insufficient documentation

## 2020-02-18 NOTE — Progress Notes (Signed)
   Covid-19 Vaccination Clinic  Name:  Erika Cherry    MRN: [id number] DOB: 01-Sep-1951  02/18/2020  [name] was observed post Covid-19 immunization for 15 minutes without incident. She was provided with Vaccine Information Sheet and instruction to access the V-Safe system.   [name] was instructed to call 911 with any severe reactions post vaccine: Marland Kitchen Difficulty breathing  . Swelling of face and throat  . A fast heartbeat  . A bad rash all over body  . Dizziness and weakness   Immunizations Administered    Name Date Dose VIS Date Route   Moderna COVID-19 Vaccine 02/18/2020  9:56 AM 0.5 mL 11/16/2019 Intramuscular   Manufacturer: Moderna   Lot: [id number]   NDC: 17711-657-90

## 2020-03-21 ENCOUNTER — Ambulatory Visit: Payer: Medicare HMO | Attending: Internal Medicine

## 2020-03-21 DIAGNOSIS — Z23 Encounter for immunization: Secondary | ICD-10-CM

## 2020-03-21 NOTE — Progress Notes (Signed)
   Covid-19 Vaccination Clinic  Name:  Erika Cherry    MRN: [id number] DOB: Mar 12, 1951  03/21/2020  [name] was observed post Covid-19 immunization for 15 minutes without incident. She was provided with Vaccine Information Sheet and instruction to access the V-Safe system.   [name] was instructed to call 911 with any severe reactions post vaccine: Marland Kitchen Difficulty breathing  . Swelling of face and throat  . A fast heartbeat  . A bad rash all over body  . Dizziness and weakness   Immunizations Administered    Name Date Dose VIS Date Route   Moderna COVID-19 Vaccine 03/21/2020  9:49 AM 0.5 mL 11/16/2019 Intramuscular   Manufacturer: Moderna   Lot: [id number]   NDC: 82707-867-54

## 2020-04-11 ENCOUNTER — Ambulatory Visit: Payer: Medicare HMO | Admitting: Neurology

## 2020-05-10 ENCOUNTER — Ambulatory Visit: Payer: Medicare HMO | Admitting: Neurology

## 2022-05-01 ENCOUNTER — Other Ambulatory Visit: Payer: Self-pay | Admitting: Internal Medicine

## 2022-05-01 DIAGNOSIS — Z1231 Encounter for screening mammogram for malignant neoplasm of breast: Secondary | ICD-10-CM

## 2022-10-04 ENCOUNTER — Ambulatory Visit
Admission: RE | Admit: 2022-10-04 | Discharge: 2022-10-04 | Disposition: A | Discharge status: Home / Self Care | Payer: Medicare HMO | Source: Ambulatory Visit | Attending: Internal Medicine | Admitting: Internal Medicine

## 2022-10-04 DIAGNOSIS — Z1231 Encounter for screening mammogram for malignant neoplasm of breast: Secondary | ICD-10-CM

## 2023-08-25 ENCOUNTER — Other Ambulatory Visit: Payer: Self-pay | Admitting: Internal Medicine

## 2023-08-25 DIAGNOSIS — Z1231 Encounter for screening mammogram for malignant neoplasm of breast: Secondary | ICD-10-CM

## 2023-10-10 ENCOUNTER — Ambulatory Visit: Payer: Medicare HMO

## 2023-10-24 ENCOUNTER — Ambulatory Visit: Payer: Medicare HMO

## 2023-11-26 ENCOUNTER — Ambulatory Visit
Admission: RE | Admit: 2023-11-26 | Discharge: 2023-11-26 | Disposition: A | Discharge status: Home / Self Care | Payer: Medicare HMO | Source: Ambulatory Visit | Attending: Internal Medicine | Admitting: Internal Medicine

## 2023-11-26 DIAGNOSIS — Z1231 Encounter for screening mammogram for malignant neoplasm of breast: Secondary | ICD-10-CM

## 2024-08-24 NOTE — Discharge Summary (Signed)
 DISCHARGE SUMMARY [facility]   Discharge date:   August 24, 2024 Length of stay:    LOS: 0 days    Discharge Service:   [facility] Hospitalists Discharge Attending Provider: Savannah Houston, MD Discharge to:    To Home with Home Health Condition at Discharge:  stable Code status:                         Full Code    ______________________________________    Hospital course Patient is a 73 y.o. female with a PMH significant for chronic back pain, headaches hyperlipidemia, hypertension, sarcoidosis, ulcerative colitis, presented to emergency room with family for further evaluation.  As per EMS patient is with the daughter, daughter stated that patient got up this morning took a shower and ate some breakfast.  Subsequently after her shower patient was drying herself up when she looks spaced out and did not respond to verbal stimuli and fell in the bathtub.  The patient's daughter stated that she was shaking and slowly came around.  There were no tonic-clonic seizure activity no loss of consciousness no head strike, no bowel or bladder incontinence.  Patient almost passed out   Patient reported no anginal chest pain.  No fevers or chills.   Patient report no nausea vomiting diarrhea no GI/GU complaints no fever.  She was admitted by hospitalist team for further evaluation and treatment for pre-syncope/syncope.  CT head was negative for any intracranial pathology.  Patient was found to have severely depleted/abnormal electrolytes.  This was aggressively corrected during the interim she was seen by cardiology and is scheduled to have an echocardiogram/Zio patch upon discharge.  Patient's blood pressure was elevated during her stay in the hospital partly due to her nausea vomiting and previously being on clonidine which could be rebound hypertension.  She was started on aspirin for secondary stroke prevention, atorvastatin and continued with amlodipine /losartan.  Patient was to be discharged on  9/7 however she was found to be in SVT, heart rate of 170 sustained, she was initiated on Lopressor which subsequently controlled her heart rate well    On the day of discharge she was hemodynamically stable and denied any acute complaints.  All questions were answered there are no barriers to any communication.  Patient daughter was present bedside during this evaluation.  Patient was encouraged to follow with her  primary care physician and cardiologist as scheduled.  She is to have an echocardiogram and further cardiac monitoring via a Zio patch    Patient admitted on Home O2? - no Patient on home anticoagulant? -  no Patient admitted with Chronic home foley catheter? - no Foley catheter placed or replaced by another service prior to admission? - no Central Line Status: NONE   Mental Status on Admission: The patient is Alert and oriented to PERSON The patient is Alert And oriented to TIME The patient is not Alert and oriented to LOCATION ______________________________________  ASSESSMENT & PLAN (In order of descending acuity)   Syncope Secondary to dehydration/poor oral intake/abnormal electrolytes Patient on multiple medication/polypharmacy Echocardiogram done 9/8 shows grade 1 diastolic dysfunction EF of 50 to 55%   CT head negative Patient to get  Zio patch/loop monitor upon discharge Patient has also been on meclizine  as needed for dizziness   Poor oral intake Continue with IV fluids monitor electrolytes, EKG Electrolytes are abnormal due to poor oral intake. Patient had an MBS 9/8 SLP- dietary nutrition consulted Potassium being corrected  Chronic lower back pain This is historical patient is on Norco as needed   History of ulcerative colitis This is historical not on any medications   History of recent UTI/cystitis patient has been on cefdinir/Cipro/Macrodantin UA positive for few bacteria and trace blood Mild dysuria Nitrofurantoin as prophylaxis   Patient  to follow-up with primary care physician, cardiologist as scheduled outpatient Home speech therapy initiated along with home health All questions were answered there were no barriers to any communication.     Timeline of Significant Events: 9/5 admitted for syncope abnormal electrolytes Cystitis Mental Status On day of Discharge:  The patient is Alert and oriented to PERSON The patient is Alert And oriented to TIME The patient is Alert and oriented to LOCATION  CODE STATUS :                    Full Code   An advanced care planning discussion washad with patient and/or patient's decisions maker (documented separately).  Foley Catheter status: None Central Line Status: NONE  Time Spent on Discharge I spent greater than 30 minutes counseling and coordinating care for the discharge of this patient. The diagnosis, follow-up appointments compliance side effects of medication were discussed in detail and  I discussed the importance of outpatient follow-up as well as concerning signs and symptoms that would require immediate evaluation by a medical professional. The aforementioned conversation participants/patient's daughter was spoken to detail and updated   The above participant/s is aware that not following the discussed plan, recommendations, and follow up can lead to severe negative effects on the patient's health, up to and including death.  Discharge Medications     Your Medication List     STOP taking these medications    azithromycin 250 MG tablet Commonly known as: ZITHROMAX Z-PAK   carvedilol  12.5 MG tablet Commonly known as: COREG    cefdinir 300 MG capsule Commonly known as: OMNICEF   ciprofloxacin HCl 500 MG tablet Commonly known as: CIPRO   cloNIDine HCL 0.2 MG tablet Commonly known as: CATAPRES   doxycycline 50 MG capsule Commonly known as: VIBRAMYCIN   famotidine 40 MG tablet Commonly known as: PEPCID   HYDROcodone -acetaminophen  10-325 mg per  tablet Commonly known as: NORCO 10-325   ipratropium 21 mcg (0.03 %) nasal spray Commonly known as: ATROVENT   ipratropium 42 mcg (0.06 %) nasal spray Commonly known as: ATROVENT   mesalamine 1.2 gram DR tablet Commonly known as: LIALDA   metoclopramide 10 MG tablet Commonly known as: REGLAN   predniSONE  20 MG tablet Commonly known as: DELTASONE    rosuvastatin 5 MG tablet Commonly known as: CRESTOR       START taking these medications    aspirin 81 MG tablet Commonly known as: ECOTRIN Take 1 tablet (81 mg total) by mouth daily.   atorvastatin 40 MG tablet Commonly known as: LIPITOR Take 1 tablet (40 mg total) by mouth daily with evening meal.   guaiFENesin 100 mg/5 mL syrup Commonly known as: ROBITUSSIN Take 10 mL (200 mg total) by mouth every four (4) hours as needed for cough.   losartan 100 MG tablet Commonly known as: COZAAR Take 1 tablet (100 mg total) by mouth daily.   metoPROLOL tartrate 25 MG tablet Commonly known as: Lopressor Take 1 tablet (25 mg total) by mouth two (2) times a day.   senna 8.6 mg tablet Commonly known as: SENOKOT Take 2 tablets by mouth nightly as needed for constipation.   thiamine mononitrate (vit  B1) 100 mg Tab tablet Take 1 tablet (100 mg total) by mouth daily.       CHANGE how you take these medications    amlodipine  10 MG tablet Commonly known as: NORVASC  Take 1 tablet (10 mg total) by mouth daily. What changed:  medication strength how much to take how to take this when to take this   potassium chloride 20 MEQ ER tablet Take 1 tablet (20 mEq total) by mouth once for 1 dose. What changed:  medication strength how much to take when to take this Another medication with the same name was removed. Continue taking this medication, and follow the directions you see here.       CONTINUE taking these medications    citalopram 20 MG tablet Commonly known as: CeleXA Take 1.5 tablets (30 mg total) by mouth  daily.   donepezil 10 MG tablet Commonly known as: ARICEPT Take 2 tablets (20 mg total) by mouth nightly.   LORazepam 1 MG tablet Commonly known as: ATIVAN Take 1 tablet (1 mg total) by mouth two (2) times a day as needed for anxiety.   meclizine  12.5 mg tablet Commonly known as: ANTIVERT  1 tablet (12.5 mg total) two (2) times a day as needed for dizziness.   memantine  10 MG tablet Commonly known as: NAMENDA  Take 1 tablet (10 mg total) by mouth two (2) times a day.   montelukast 10 mg tablet Commonly known as: SINGULAIR Take 1 tablet (10 mg total) by mouth in the morning.   nitrofurantoin 50 MG capsule Commonly known as: MACRODANTIN Take 2 capsules (100 mg total) by mouth two (2) times a day.   omeprazole 40 MG capsule Commonly known as: PriLOSEC Take 1 capsule (40 mg total) by mouth daily.       _____________________________________  Nutrition:                                   Patient does not meet AND/ASPEN criteria for malnutrition at this time (08/20/24 1530)             ___________________________________________  Discharge Instructions   Nutrition:                                   Activity:                                   Activity Instructions     Activity as tolerated         Appointments:                         Appointments which have been scheduled for you   [facility] on [facility] with Dr Secundino on 09/01/2024 at 8:45 am      Follow Up:                              Follow Up instructions and Outpatient Referrals    Ambulatory Referral to Cardiology     Reason for referral: hospital follow up for syncope   Specific Service Requested: General Cardiology   Requested follow up plan: You would evaluate and manage.   Ambulatory Referral to Home Health  Reason for referral: HH   Requested SOC Date: 08/23/2024   Requested follow up plan: You would evaluate and manage.   Disciplines requested:  Nursing Physical Therapy Occupational  Therapy Speech Language Pathology Home Health Aide Medical Social Work     Nursing requested: Teaching/skilled observation and assessment   What teaching is needed (new diagnosis? new medications?): aspiration  signs   Physical Therapy requested: Evaluate and treat   Occupational Therapy Requested: Evaluate and treat   Speech Language Pathology requested:  Swallow training Evaluate and treat     Medical Social Work requested: General area of patient need   General area of patient need: extra help in the home, medicaid  application   Current community services in place: Serious Illness   Physician to follow patient's care: PCP Comment - Sherida Edison   Requested start of care date: Routine (within 48 hours)   Ambulatory Referral to Palliative Care     Reason for referral: Palliative   Is this a palliative referral for:  symptom management goals of care coping support     Requested follow up plan: You would evaluate and manage.   Schedule Follow-up visit with Primary Care Physician     Reason for referral: Follow-up with primary care physician 1 week as  scheduled   Schedule Follow-up visit with Primary Care Physician     Reason for referral: Posthospitalization follow-up in 1 week   Ambulatory Referral to Cardiology     Reason for referral: Follow-up cardiology 1 week as scheduled   Specific Service Requested: General Cardiology   Call MD for:  difficulty breathing, headache or visual disturbances     Call MD for:  difficulty breathing, headache or visual disturbances     Call MD for:  extreme fatigue     Call MD for:  extreme fatigue     Call MD for:  hives     Call MD for:  hives     Call MD for:  persistent dizziness or light-headedness     Call MD for:  persistent dizziness or light-headedness     Call MD for:  persistent nausea or vomiting     Call MD for:  persistent nausea or vomiting     Call MD for:  redness, tenderness, or signs of infection (pain, swelling,   redness, odor or green/yellow discharge around incision site)     Call MD for:  redness, tenderness, or signs of infection (pain, swelling,  redness, odor or green/yellow discharge around incision site)     Call MD for:  severe uncontrolled pain     Call MD for:  severe uncontrolled pain     Call MD for: Temperature > 38.5 Celsius ( > 101.3 Fahrenheit)     Call MD for: Temperature > 38.5 Celsius ( > 101.3 Fahrenheit)         Allergies  Allergen Reactions  . Sulfa (Sulfonamide Antibiotics) Itching and Rash  . Flagyl [Metronidazole]   . Sulfur Itching     Past Medical History[1]  Past Surgical History[2]   Family History[3]   Current Medications[4]  Imaging  Echocardiogram W Colorflow Spectral Doppler With Contrast Result Date: 08/23/2024 Patient Info Name:     Erika Cherry Age:     35 years DOB:     November 01, 1951 Gender:     Female MRN:     [id number] Accession #:     [id number] [facility] Account #:     [id number] Ht:     175  cm Wt:     62 kg BSA:     1.73 m2 BP:     161 /     79 mmHg HR:     75 bpm Exam Date:     08/23/2024 10:58 AM Admit Date:     08/19/2024 Exam Type:     ECHOCARDIOGRAM W COLORFLOW SPECTRAL DOPPLER W CONTRAST Technical Quality:     Poor Reason for Poor Study:     poor echocardiographic windows Staff Sonographer:     Mliss Gate Supervising Physician:     Marinell Fairy How MD Ordering Physician:     Savannah Houston Study Info Indications      - Definity/Optison if needed ; Syncope/fall Procedure(s)   Complete two-dimensional, color flow and Doppler transthoracic echocardiogram is performed. Summary   1. Technically difficult study.   2. The left ventricular systolic function is borderline, LVEF is visually estimated at 50-55%.   3. There is grade I diastolic dysfunction (impaired relaxation). Left Ventricle   The left ventricular systolic function is borderline, LVEF is visually estimated at 50-55%. There is grade I diastolic dysfunction (impaired relaxation). Right  Ventricle   Right ventricle is not well visualized. Left Atrium   The left atrium is normal in size. Right Atrium   The right atrium is normal in size. Aortic Valve   The aortic valve is poorly visualized. Aortic stenosis is unlikely based on doppler assessment. There is no significant aortic regurgitation. Mitral Valve   The mitral valve leaflets are poorly visualized but probably normal. There is no significant mitral valve regurgitation. Tricuspid Valve   The tricuspid valve is not well visualized. The pulmonary systolic pressure cannot be estimated due to insufficient TR signal. Pulmonic Valve   Pulmonary valve is not well visualized. Inferior Vena Cava   Estimated right atrial pressure is 3 mmHg. Ventricles ---------------------------------------------------------------------- Name                                 Value        Normal ---------------------------------------------------------------------- LV Function ---------------------------------------------------------------------- LV EF (4C MOD)                        63 %                LV Diastolic Volume Index (BP MOD)                        37.3 ml/m2     29.0-61.0 LV EF (BP MOD)                        55 %         54-74 RV Dimensions 2D/MM ---------------------------------------------------------------------- TAPSE                               2.8 cm         >=1.7 Atria ---------------------------------------------------------------------- Name                                 Value        Normal ---------------------------------------------------------------------- LA Dimensions ---------------------------------------------------------------------- LA Volume Index (4C A-L)        39.08 ml/m2  LA Volume Index (2C A-L)        15.36 ml/m2               LA Volume (BP MOD)                   44 ml               LA Volume Index (BP MOD)        25.13 ml/m2   16.00-34.00 RA Dimensions  ---------------------------------------------------------------------- RA Area (4C)                      10.4 cm2        <=18.0 RA Area (4C) Index              6.0 cm2/m2               RA ESV Index (4C MOD)             13 ml/m2         15-27 Left Ventricular Outflow Tract ---------------------------------------------------------------------- Name                                 Value        Normal ---------------------------------------------------------------------- LVOT Doppler ---------------------------------------------------------------------- LVOT Peak Velocity                 0.9 m/s               LVOT VTI                             18 cm Aortic Valve ---------------------------------------------------------------------- Name                                 Value        Normal ---------------------------------------------------------------------- AV Doppler ---------------------------------------------------------------------- AV Peak Velocity                   1.1 m/s               AV Peak Gradient                    5 mmHg               AV Mean Gradient                    2 mmHg               AV VTI                               17 cm               AV DI (Vel)                           0.87               AV DI (VTI)                           1.01 Mitral Valve ---------------------------------------------------------------------- Name  Value        Normal ---------------------------------------------------------------------- MV Diastolic Function ---------------------------------------------------------------------- MV E Peak Velocity                 67 cm/s               MV A Peak Velocity                 97 cm/s               MV E/A                                 0.7               MV Annular TDI ---------------------------------------------------------------------- MV Septal e' Velocity             7.0 cm/s         >=8.0 MV E/e' (Septal)                       9.7                MV Lateral e' Velocity            6.3 cm/s        >=10.0 MV E/e' (Lateral)                     10.7               MV e' Average                     6.6 cm/s               MV E/e' (Average)                     10.2 Tricuspid Valve ---------------------------------------------------------------------- Name                                 Value        Normal ---------------------------------------------------------------------- Estimated PAP/RSVP ---------------------------------------------------------------------- RA Pressure                         3 mmHg           <=5 Venous ---------------------------------------------------------------------- Name                                 Value        Normal ---------------------------------------------------------------------- IVC/SVC ---------------------------------------------------------------------- IVC Diameter (Insp 2D)              0.9 cm               IVC Diameter (Exp 2D)               1.8 cm         <=2.1  IVC Diameter Percent Change (2D)                                  50 %          >=50 Report Signatures Finalized by Earla Currier  MD on 08/23/2024 08:29 PM  PVL Carotid Duplex Bilateral Result  Date: 08/23/2024 Exam:  Ultrasound of the Carotid Arteries  History:  Syncope. Fall  Technique:  Real-time high-resolution ultrasound of both the left and right carotid and vertebral arteries was performed. In addition to the grayscale examination, the study was supplemented with color flow and Doppler interrogation.  Comparison:   None.  Velocity measurements and ratios:  RIGHT CAROTID DUPLEX DATA: ICA peak systolic velocity:  193 cm/sec ICA end diastolic velocity:  8 cm/sec  CCA peak systolic velocity:  60 cm/sec CCA end diastolic velocity:  2 cm/sec  Systolic ratio ICA/CCA:  3.2  LEFT CAROTID DUPLEX DATA: ICA peak systolic velocity:  100 cm/sec ICA end diastolic velocity:  9 cm/sec  CCA peak systolic velocity:  83 cm/sec CCA end diastolic velocity:  0 cm/sec   Systolic ratio ICA/CCA:  1.2  COMMENT: All measurements of stenosis in the report above were calculated using the NASCET [facility] Symptomatic Carotid Endarterectomy Trial) methodology for measuring carotid artery stenosis, whereby the narrowest transverse measurement of luminal dimension at the point of the maximum stenosis is stated relative to the diameter of the closest normal-appearing portion of the same vessel beyond the stenosis.  Findings:  Examination is mildly limited by patient motion.   Atherosclerosis is seen within the bilateral common carotid arteries and carotid bulbs. Elevation of peak systolic velocity noted within the distal right internal carotid artery, consistent with a stenosis less than 70%. No hemodynamically significant stenosis is seen.    1.    Atherosclerosis within the bilateral common carotid arteries and carotid bulbs. 2.    Elevation of peak systolic velocity within the distal right internal carotid artery, consistent with a stenosis less than 70%.  Signed (Electronic Signature): 08/23/2024 1:37 PM Signed By: Leita Poisson, MD  ECG 12 Lead Result Date: 08/22/2024 Atrial fibrillation with rapid ventricular response Posterior infarct , age undetermined ST & T wave abnormality, consider lateral ischemia Abnormal ECG When compared with ECG of 19-Aug-2024 14:49, ST now depressed in Inferior leads ST now depressed in Lateral leads T wave inversion no longer evident in Anterior leads  ECG 12 Lead Result Date: 08/21/2024 Atrial fibrillation with rapid ventricular response with premature ventricular or aberrantly conducted complexes ST & T wave abnormality, consider inferior ischemia ST & T wave abnormality, consider anterolateral ischemia Abnormal ECG When compared with ECG of 05-Jun-2024 19:30, Significant changes have occurred Patient tremor degrades the rhythm interpretation Confirmed by Rosamond Codding [phone]) on 08/21/2024 8:01:09 AM  XR Abdomen 1 View Result Date:  08/20/2024 Exam:  Abdomen 1 view  (supine)  History:  Abdominal pain.  Technique:  Supine view of the abdomen  Comparison:  None.  Findings:  Large gaseous distention of the stomach. Mild gaseous distention diffusely of small bowel loops. Mild/moderate gaseous distention of the colon. No supine evidence of free air. Skinfold noted left lateral abdomen. No acute osseous finding. Right mid abdomen 1.4 cm oval calcification.    1.    Gaseous distention of the stomach greater than small bowel and colon may represent ileus. No finding specific for obstruction. 2.    Right mid abdomen 1.4 cm oval calcification could represent a gallstone or less likely renal calculus.  Signed (Electronic Signature): 08/20/2024 10:00 AM Signed By: Nelwyn JAYSON Blush, MD  XR Chest Portable Result Date: 08/19/2024 Exam:  Portable Chest  History:  Altered mental status  Technique:  Single frontal view.  Comparison:  June 01, 2011  Findings:   When accounting for rotation, positioning the cardiac mediastinal silhouette  is similar to prior. There is congestion and mild peribronchial thickening. Low lung volumes and vascular crowding. No definite consolidative opacity to suggest pneumonia. Elevated left hemidiaphragm. Possible trace right pleural effusion. No pneumothorax.  No acute abnormality of the bones.    1.    Low lung volumes and vascular crowding. 2.    Mild peribronchial thickening is nonspecific and may be seen in the setting of viral infection and pulmonary venous congestion/interstitial edema. 3.    Possible trace right pleural effusion.  Signed (Electronic Signature): 08/19/2024 2:49 PM Signed By: Ellouise Platt, MD  CT head without contrast Result Date: 08/19/2024 Exam:  CT Head without Contrast  History:  Fall, syncope  Technique: Routine brain CT without IV contrast. AEC (automated exposure control) and/or manual techniques such as size-specific kV and mAs are employed where appropriate to reduce radiation exposure for all CT  exams.  Comparison:  06/05/2024  Findings:   BRAIN:  No CT evidence of acute infarction, hemorrhage, edema, mass or mass effect. Generalized prominence of the cerebral ventricular system, similar to previous exam. Moderate white matter microvascular ischemic change.   SOFT TISSUES:  Negative. CALVARIUM:  Negative. No fracture. SINUSES AND MASTOIDS:  No mucosal thickening or fluid.    No acute intracranial findings.  Signed (Electronic Signature): 08/19/2024 1:55 PM Signed By: Luetta Ambrosia, MD   Lab Results   Recent Labs    08/24/24 0545  WBC 10.0  HGB 15.3*  HCT 44.8*  PLT 186   Recent Labs    08/22/24 0623 08/23/24 0316 08/24/24 0545  NA 137 140 135  K 3.5 3.4* 3.1*  CL 103 101 102  CO2 24.4 26.8 28.8  BUN 16 39* 37*  CREATININE 0.85 1.17* 0.80  GLU 122 103 95  CALCIUM 9.4 9.4 8.7  MG 2.3  --   --   TSH  --  1.329  --    No results for input(s): CKTOTAL, CKMB, PCTCKMB, TROPONINI, EDTPNI, BNP, INR, LABPROT, APTT, DDIMER in the last 72 hours.  No results for input(s): WBCUA, NITRITE, LEUKOCYTESUR, BACTERIA, RBCUA, BLOODU, GLUCOSEU, PROTEINUA, KETONESU, KETUR in the last 72 hours.  No results for input(s): OPIAU, BENZU, TRICYCLIC, PCPU, AMPHU, COCAU, CANNAU, BARBU, ETOH, ACETAMIN, SALICYLATE in the last 72 hours. No results for input(s): PREGTESTUR, PREGPOC in the last 72 hours. No results for input(s): OCCULTBLD, RAPSCRN, CDIFRPCR, CDIFFNAP1, A1C, CHOL, LDL, HDL, TRIG in the last 72 hours.  No results for input(s): O2SOUR, FIO2ART, PHART, PCO2ART, PO2ART, HCO3ART, O2SATART, BEART in the last 72 hours.    Home Medications   Prior to Admission medications  Medication Dose, Route, Frequency  amlodipine  (NORVASC ) 10 MG tablet 10 mg, Oral, Daily (standard)  amLODIPine  (NORVASC ) 5 MG tablet No dose, route, or frequency recorded. Patient not taking: Reported on 08/19/2024   aspirin (ECOTRIN) 81 MG tablet 81 mg, Oral, Daily (standard)  atorvastatin (LIPITOR) 40 MG tablet 40 mg, Oral, Daily  azithromycin (ZITHROMAX Z-PAK) 250 MG tablet Take 2 tablets (500 mg) on  Day 1,  followed by 1 tablet (250 mg) once daily on Days 2 through 5. Patient not taking: Reported on 08/19/2024  carvedilol  (COREG ) 12.5 MG tablet 12.5 mg, Oral, 2 times a day (standard)  cefdinir (OMNICEF) 300 MG capsule   ciprofloxacin HCl (CIPRO) 500 MG tablet TAKE 1 TABLET 2 TIMES DAILY (ANTIBIOTIC) Patient not taking: Reported on 08/19/2024  citalopram (CELEXA) 20 MG tablet 30 mg, Oral, Daily (standard)  cloNIDine HCl (CATAPRES) 0.2 MG tablet No dose, route,  or frequency recorded. Patient not taking: Reported on 08/19/2024  donepeziL (ARICEPT) 10 MG tablet 20 mg, Oral, Nightly  doxycycline (VIBRAMYCIN) 50 MG capsule TAKE 1 (ONE) CAPSULE BY MOUTH TWO TIMES DAILY, FOR ANTIBIOTIC Patient not taking: Reported on 08/19/2024  famotidine (PEPCID) 40 MG tablet 40 mg, Oral, Nightly  guaiFENesin (ROBITUSSIN) 100 mg/5 mL syrup 200 mg, Oral, Every 4 hours PRN  HYDROcodone -acetaminophen  (NORCO 10-325) 10-325 mg per tablet   ipratropium (ATROVENT) 21 mcg (0.03 %) nasal spray 1 spray, Each Nare, 3 times a day (standard)  ipratropium (ATROVENT) 42 mcg (0.06 %) nasal spray 2 sprays, Each Nare, 3 times a day (standard)  LORazepam (ATIVAN) 1 MG tablet 1 mg, Oral, 2 times a day PRN  losartan (COZAAR) 100 MG tablet 100 mg, Oral, Daily (standard)  meclizine  (ANTIVERT ) 12.5 mg tablet 12.5 mg, 2 times a day PRN  memantine  (NAMENDA ) 10 MG tablet 10 mg, Oral, 2 times a day (standard)  mesalamine (LIALDA) 1.2 gram DR tablet No dose, route, or frequency recorded. Patient not taking: Reported on 08/19/2024  metoclopramide (REGLAN) 10 MG tablet 10 mg, Oral, Daily  metoPROLOL tartrate (LOPRESSOR) 25 MG tablet 25 mg, Oral, 2 times a day (standard)  montelukast (SINGULAIR) 10 mg tablet Take 1 tablet (10 mg total) by mouth in the  morning.  nitrofurantoin (MACRODANTIN) 50 MG capsule 100 mg, Oral, 2 times a day (standard)  omeprazole (PRILOSEC) 40 MG capsule 40 mg, Oral, Daily (standard)  potassium chloride (KLOR-CON) 10 MEQ CR tablet Take 5 tablets (50 mEq total) by mouth every Monday, Wednesday, and Friday.  potassium chloride 10 MEQ ER tablet 40 mEq, Oral, Tuesday, Thursday, Saturday, Sunday  potassium chloride 20 MEQ ER tablet 20 mEq, Oral, Once  predniSONE  (DELTASONE ) 20 MG tablet 20 mg, Daily (standard) Patient not taking: Reported on 08/19/2024  rosuvastatin (CRESTOR) 5 MG tablet 5 mg, Oral, Daily (standard)  senna (SENOKOT) 8.6 mg tablet 2 tablets, Oral, Nightly PRN  thiamine mononitrate, vit B1, 100 mg Tab tablet 100 mg, Oral, Daily (standard)   Savannah Houston, MD Hospitalist, [facility] 08/24/24, 12:55 PM       [1] Past Medical History: Diagnosis Date  . Anxiety   . Dementia (CMS-HCC)   . Depression   . GERD (gastroesophageal reflux disease)   . Hyperlipemia   . Hypertension   [2] Past Surgical History: Procedure Laterality Date  . APPENDECTOMY    . SHOULDER SURGERY Right   . TONSILLECTOMY    [3] Family History Problem Relation Age of Onset  . Dementia Mother   . Hypertension Mother   . Heart failure Father   . Hypertension Father   . Breast cancer Neg Hx   [4]  Current Facility-Administered Medications:  .  acetaminophen  (TYLENOL ) tablet 487.5 mg, 487.5 mg, Oral, Q6H PRN, Baveja, Punit, MD .  albuterol  2.5 mg /3 mL (0.083 %) nebulizer solution 2.5 mg, 2.5 mg, Nebulization, Q4H PRN, Baveja, Punit, MD .  amlodipine  (NORVASC ) tablet 10 mg, 10 mg, Oral, Daily, Baveja, Punit, MD, 10 mg at 08/24/24 0827 .  aspirin (ECOTRIN) tablet 81 mg, 81 mg, Oral, Daily, Baveja, Punit, MD, 81 mg at 08/24/24 0827 .  atorvastatin (LIPITOR) tablet 40 mg, 40 mg, Oral, Daily, Baveja, Punit, MD, 40 mg at 08/23/24 1713 .  enoxaparin (LOVENOX) syringe 40 mg, 40 mg, Subcutaneous, Q24H, Baveja, Punit, MD, 40 mg at  08/23/24 1713 .  guaiFENesin (ROBITUSSIN) oral syrup, 200 mg, Oral, Q4H PRN, Baveja, Punit, MD, 200 mg at 08/20/24 1701 .  hydrALAZINE (APRESOLINE) injection 10 mg, 10 mg, Intravenous, Q6H PRN, Hugelmeyer, Alexis, DO, 10 mg at 08/22/24 0459 .  losartan (COZAAR) tablet 100 mg, 100 mg, Oral, Daily, Baveja, Punit, MD, 100 mg at 08/24/24 0827 .  melatonin tablet 3 mg, 3 mg, Oral, Nightly PRN, Baveja, Punit, MD .  metoPROLOL (Lopressor) injection 5 mg, 5 mg, Intravenous, Q3H PRN, Baveja, Punit, MD, 5 mg at 08/22/24 1601 .  metoPROLOL tartrate (Lopressor) tablet 25 mg, 25 mg, Oral, BID, Baveja, Punit, MD, 25 mg at 08/24/24 0826 .  oxyCODONE (ROXICODONE) immediate release tablet 5 mg, 5 mg, Oral, Q4H PRN, Baveja, Punit, MD .  senna (SENOKOT) tablet 2 tablet, 2 tablet, Oral, Nightly PRN, Baveja, Punit, MD .  thiamine mononitrate (vit B1) tablet 100 mg, 100 mg, Oral, Daily, Baveja, Punit, MD, 100 mg at 08/24/24 [phone]

## 2024-10-14 ENCOUNTER — Other Ambulatory Visit: Payer: Self-pay | Admitting: Internal Medicine

## 2024-10-14 DIAGNOSIS — Z1231 Encounter for screening mammogram for malignant neoplasm of breast: Secondary | ICD-10-CM

## 2024-11-26 ENCOUNTER — Ambulatory Visit

## 2024-12-24 ENCOUNTER — Ambulatory Visit
Admission: RE | Admit: 2024-12-24 | Discharge: 2024-12-24 | Disposition: A | Source: Ambulatory Visit | Attending: Internal Medicine | Admitting: Internal Medicine

## 2024-12-24 DIAGNOSIS — Z1231 Encounter for screening mammogram for malignant neoplasm of breast: Secondary | ICD-10-CM
